# Patient Record
Sex: Female | Born: 1995 | Race: White | Hispanic: No | Marital: Single | Smoking: Former smoker
Health system: Southern US, Community
[De-identification: ages and names within clinical notes are randomized; demographics above are authoritative.]

## PROBLEM LIST (undated history)

## (undated) ENCOUNTER — Emergency Department: Admission: EM | Discharge status: Absent without leave | Payer: No Typology Code available for payment source

## (undated) DIAGNOSIS — R87612 Low grade squamous intraepithelial lesion on cytologic smear of cervix (LGSIL): Secondary | ICD-10-CM

## (undated) DIAGNOSIS — K219 Gastro-esophageal reflux disease without esophagitis: Secondary | ICD-10-CM

## (undated) DIAGNOSIS — H10029 Other mucopurulent conjunctivitis, unspecified eye: Secondary | ICD-10-CM

## (undated) HISTORY — DX: Other mucopurulent conjunctivitis, unspecified eye: H10.029

## (undated) HISTORY — PX: WISDOM TOOTH EXTRACTION: SHX21

## (undated) HISTORY — DX: Low grade squamous intraepithelial lesion on cytologic smear of cervix (LGSIL): R87.612

---
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
## 2015-02-21 LAB — HM PAP SMEAR: HM PAP: NEGATIVE

## 2015-09-03 ENCOUNTER — Ambulatory Visit
Admission: RE | Admit: 2015-09-03 | Discharge: 2015-09-03 | Disposition: A | Discharge status: Home / Self Care | Payer: No Typology Code available for payment source | Source: Ambulatory Visit | Attending: Pediatrics | Admitting: Pediatrics

## 2015-09-03 ENCOUNTER — Other Ambulatory Visit: Payer: Self-pay | Admitting: Pediatrics

## 2015-09-03 DIAGNOSIS — M25562 Pain in left knee: Secondary | ICD-10-CM | POA: Insufficient documentation

## 2015-10-20 ENCOUNTER — Other Ambulatory Visit
Admission: RE | Admit: 2015-10-20 | Discharge: 2015-10-20 | Disposition: A | Discharge status: Home / Self Care | Payer: No Typology Code available for payment source | Attending: Family Medicine | Admitting: Family Medicine

## 2015-10-20 NOTE — ED Notes (Signed)
Pt brought to ED by [employer] officer Koleen Nimrod for forensic blood draw; blood drawn from right antecubital per protocol with kit provided by officer; blood labeled by officer Koleen Nimrod with this nurse present and officer left with specimens
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
## 2017-10-13 DIAGNOSIS — R87612 Low grade squamous intraepithelial lesion on cytologic smear of cervix (LGSIL): Secondary | ICD-10-CM

## 2017-10-13 HISTORY — DX: Low grade squamous intraepithelial lesion on cytologic smear of cervix (LGSIL): R87.612

## 2017-10-24 ENCOUNTER — Ambulatory Visit (INDEPENDENT_AMBULATORY_CARE_PROVIDER_SITE_OTHER): Payer: BLUE CROSS/BLUE SHIELD | Admitting: Obstetrics and Gynecology

## 2017-10-24 ENCOUNTER — Encounter: Payer: Self-pay | Admitting: Obstetrics and Gynecology

## 2017-10-24 VITALS — BP 100/60 | Ht 63.0 in | Wt 125.0 lb

## 2017-10-24 DIAGNOSIS — Z113 Encounter for screening for infections with a predominantly sexual mode of transmission: Secondary | ICD-10-CM

## 2017-10-24 DIAGNOSIS — Z01419 Encounter for gynecological examination (general) (routine) without abnormal findings: Secondary | ICD-10-CM

## 2017-10-24 DIAGNOSIS — Z30011 Encounter for initial prescription of contraceptive pills: Secondary | ICD-10-CM

## 2017-10-24 DIAGNOSIS — Z124 Encounter for screening for malignant neoplasm of cervix: Secondary | ICD-10-CM | POA: Diagnosis not present

## 2017-10-24 MED ORDER — LEVONORGESTREL-ETHINYL ESTRAD 0.1-20 MG-MCG PO TABS: 1.0000 | ORAL_TABLET | Freq: Every day | ORAL | 12 refills | Status: DC

## 2017-10-24 NOTE — Progress Notes (Signed)
PCP:  Tresa ResJohnson, David S, MD   Chief Complaint  Patient presents with  . Gynecologic Exam     HPI:      [name] is a 21 y.o. No obstetric history on file. who LMP was Patient's last menstrual period was 10/15/2017., presents today for her annual examination.  Her menses are regular every 28-30 days, lasting 6 days.  Dysmenorrhea mild, occurring first 1-2 days of flow. She does not have intermenstrual bleeding.  Sex activity: single partner, contraception - none.  Pt interested in OCPs. Did tri sprintec in the past but had mood changes/depression sx with them. Hx of depression in the past anyway but pt better now. No SI. Hx of STDs: none  There is no FH of breast cancer. There is no FH of ovarian cancer. The patient does not do self-breast exams.  Tobacco use: The patient currently smokes 1/2 packs of cigarettes per day for the past several years. Alcohol use: social drinker No drug use.  Exercise: moderately active  She does not get adequate calcium and Vitamin D in her diet.   Past Medical History:  Diagnosis Date  . Pink eye     Past Surgical History:  Procedure Laterality Date  . WISDOM TOOTH EXTRACTION      Family History  Problem Relation Age of Onset  . Cancer Maternal Grandmother 40       cyst or cancer not sure    Social History   Socioeconomic History  . Marital status: Single    Spouse name: Not on file  . Number of children: Not on file  . Years of education: Not on file  . Highest education level: Not on file  Social Needs  . Financial resource strain: Not on file  . Food insecurity - worry: Not on file  . Food insecurity - inability: Not on file  . Transportation needs - medical: Not on file  . Transportation needs - non-medical: Not on file  Occupational History  . Not on file  Tobacco Use  . Smoking status: Current Every Day Smoker  . Smokeless tobacco: Never Used  Substance and Sexual Activity  . Alcohol use: Yes  . Drug  use: No  . Sexual activity: Yes    Birth control/protection: None  Other Topics Concern  . Not on file  Social History Narrative  . Not on file    No outpatient medications have been marked as taking for the 10/24/17 encounter (Office Visit) with Esma Kilts B, PA-C.     ROS:  Review of Systems  Constitutional: Positive for fatigue. Negative for fever and unexpected weight change.  Respiratory: Negative for cough, shortness of breath and wheezing.   Cardiovascular: Negative for chest pain, palpitations and leg swelling.  Gastrointestinal: Negative for blood in stool, constipation, diarrhea, nausea and vomiting.  Endocrine: Negative for cold intolerance, heat intolerance and polyuria.  Genitourinary: Negative for dyspareunia, dysuria, flank pain, frequency, genital sores, hematuria, menstrual problem, pelvic pain, urgency, vaginal bleeding, vaginal discharge and vaginal pain.  Musculoskeletal: Negative for back pain, joint swelling and myalgias.  Skin: Negative for rash.  Neurological: Negative for dizziness, syncope, light-headedness, numbness and headaches.  Hematological: Negative for adenopathy.  Psychiatric/Behavioral: Negative for agitation, confusion, dysphoric mood, sleep disturbance and suicidal ideas. The patient is not nervous/anxious.      Objective: BP 100/60   Ht 5\' 3"  (1.6 m)   Wt 125 lb (56.7 kg)   LMP 10/15/2017   BMI 22.14 kg/m  Physical Exam  Constitutional: She is oriented to person, place, and time. She appears well-developed and well-nourished.  Genitourinary: Vagina normal and uterus normal. There is no rash or tenderness on the right labia. There is no rash or tenderness on the left labia. No erythema or tenderness in the vagina. No vaginal discharge found. Right adnexum does not display mass and does not display tenderness. Left adnexum does not display mass and does not display tenderness. Cervix does not exhibit motion tenderness or polyp.  Uterus is not enlarged or tender.  Neck: Normal range of motion. No thyromegaly present.  Cardiovascular: Normal rate, regular rhythm and normal heart sounds.  No murmur heard. Pulmonary/Chest: Effort normal and breath sounds normal. Right breast exhibits no mass, no nipple discharge, no skin change and no tenderness. Left breast exhibits no mass, no nipple discharge, no skin change and no tenderness.  Abdominal: Soft. There is no tenderness. There is no guarding.  Musculoskeletal: Normal range of motion.  Neurological: She is alert and oriented to person, place, and time. No cranial nerve deficit.  Psychiatric: She has a normal mood and affect. Her behavior is normal.  Vitals reviewed.   Assessment/Plan: Encounter for annual routine gynecological examination  Cervical cancer screening - Plan: IGP,CtNgTv,rfx Aptima HPV ASCU  Screening for STD (sexually transmitted disease) - Plan: IGP,CtNgTv,rfx Aptima HPV ASCU  Encounter for initial prescription of contraceptive pills - OCP start with menses. Condoms for 1 mo. Rx aviane. F/u prn depression sx. - Plan: levonorgestrel-ethinyl estradiol (AVIANE) 0.1-20 MG-MCG tablet  Meds ordered this encounter  Medications  . levonorgestrel-ethinyl estradiol (AVIANE) 0.1-20 MG-MCG tablet    Sig: Take 1 tablet daily by mouth.    Dispense:  28 tablet    Refill:  12             GYN counsel family planning choices, adequate intake of calcium and vitamin D, diet and exercise     F/U  Return in about 1 year (around 10/24/2018).  Aayden Cefalu B. Janyla Biscoe, PA-C 10/24/2017 9:20 AM

## 2017-10-24 NOTE — Patient Instructions (Addendum)
I value your feedback and entrusting us with your care. If you get a [facility] patient survey, I would appreciate you taking the time to let us know about your experience. Thank you!

## 2017-10-28 LAB — IGP,CTNGTV,RFX APTIMA HPV ASCU
CHLAMYDIA, NUC. ACID AMP: NEGATIVE
GONOCOCCUS, NUC. ACID AMP: NEGATIVE
PAP Smear Comment: 0
Trich vag by NAA: NEGATIVE

## 2017-11-01 ENCOUNTER — Encounter: Payer: Self-pay | Admitting: Obstetrics and Gynecology

## 2017-11-02 ENCOUNTER — Telehealth: Payer: Self-pay | Admitting: Obstetrics and Gynecology

## 2017-11-02 NOTE — Telephone Encounter (Signed)
LMTRC

## 2017-11-02 NOTE — Telephone Encounter (Signed)
Pt is calling back due to missed call. Please advise °

## 2017-11-02 NOTE — Telephone Encounter (Signed)
Done

## 2018-10-03 ENCOUNTER — Other Ambulatory Visit: Payer: Self-pay | Admitting: Obstetrics and Gynecology

## 2018-10-03 DIAGNOSIS — Z30011 Encounter for initial prescription of contraceptive pills: Secondary | ICD-10-CM

## 2018-10-29 ENCOUNTER — Other Ambulatory Visit: Payer: Self-pay | Admitting: Obstetrics and Gynecology

## 2018-10-29 DIAGNOSIS — Z30011 Encounter for initial prescription of contraceptive pills: Secondary | ICD-10-CM

## 2018-11-11 ENCOUNTER — Other Ambulatory Visit: Payer: Self-pay | Admitting: Obstetrics and Gynecology

## 2018-11-11 DIAGNOSIS — Z30011 Encounter for initial prescription of contraceptive pills: Secondary | ICD-10-CM

## 2019-05-10 ENCOUNTER — Other Ambulatory Visit: Payer: Self-pay

## 2019-05-10 ENCOUNTER — Ambulatory Visit (INDEPENDENT_AMBULATORY_CARE_PROVIDER_SITE_OTHER): Payer: PRIVATE HEALTH INSURANCE | Admitting: Obstetrics and Gynecology

## 2019-05-10 ENCOUNTER — Encounter: Payer: Self-pay | Admitting: Obstetrics and Gynecology

## 2019-05-10 DIAGNOSIS — Z3A01 Less than 8 weeks gestation of pregnancy: Secondary | ICD-10-CM

## 2019-05-10 DIAGNOSIS — Z3401 Encounter for supervision of normal first pregnancy, first trimester: Secondary | ICD-10-CM

## 2019-05-10 NOTE — Progress Notes (Signed)
NOB C/o nausea, some cramping, no vb  No flu shot this season  Worried about how much she has been drinking, and current smoker
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
## 2019-05-25 ENCOUNTER — Other Ambulatory Visit: Payer: Self-pay

## 2019-05-25 ENCOUNTER — Ambulatory Visit (INDEPENDENT_AMBULATORY_CARE_PROVIDER_SITE_OTHER): Payer: PRIVATE HEALTH INSURANCE | Admitting: Obstetrics and Gynecology

## 2019-05-25 ENCOUNTER — Ambulatory Visit (INDEPENDENT_AMBULATORY_CARE_PROVIDER_SITE_OTHER): Payer: PRIVATE HEALTH INSURANCE

## 2019-05-25 ENCOUNTER — Other Ambulatory Visit (HOSPITAL_COMMUNITY)
Admission: RE | Admit: 2019-05-25 | Discharge: 2019-05-25 | Disposition: A | Discharge status: Home / Self Care | Payer: PRIVATE HEALTH INSURANCE | Source: Ambulatory Visit | Attending: Obstetrics and Gynecology | Admitting: Obstetrics and Gynecology

## 2019-05-25 VITALS — BP 110/70 | Wt 131.0 lb

## 2019-05-25 DIAGNOSIS — Z124 Encounter for screening for malignant neoplasm of cervix: Secondary | ICD-10-CM

## 2019-05-25 DIAGNOSIS — O0990 Supervision of high risk pregnancy, unspecified, unspecified trimester: Secondary | ICD-10-CM | POA: Insufficient documentation

## 2019-05-25 DIAGNOSIS — O3481 Maternal care for other abnormalities of pelvic organs, first trimester: Secondary | ICD-10-CM | POA: Diagnosis not present

## 2019-05-25 DIAGNOSIS — Z3A10 10 weeks gestation of pregnancy: Secondary | ICD-10-CM | POA: Diagnosis not present

## 2019-05-25 DIAGNOSIS — Z3401 Encounter for supervision of normal first pregnancy, first trimester: Secondary | ICD-10-CM | POA: Insufficient documentation

## 2019-05-25 DIAGNOSIS — Z3A09 9 weeks gestation of pregnancy: Secondary | ICD-10-CM

## 2019-05-25 DIAGNOSIS — O219 Vomiting of pregnancy, unspecified: Secondary | ICD-10-CM

## 2019-05-25 DIAGNOSIS — Z113 Encounter for screening for infections with a predominantly sexual mode of transmission: Secondary | ICD-10-CM

## 2019-05-25 DIAGNOSIS — N8312 Corpus luteum cyst of left ovary: Secondary | ICD-10-CM | POA: Diagnosis not present

## 2019-05-25 LAB — OB RESULTS CONSOLE VARICELLA ZOSTER ANTIBODY, IGG: Varicella: NON-IMMUNE/NOT IMMUNE

## 2019-05-25 MED ORDER — DOXYLAMINE-PYRIDOXINE 10-10 MG PO TBEC: 2.0000 | DELAYED_RELEASE_TABLET | Freq: Every day | ORAL | 5 refills | Status: DC

## 2019-05-25 NOTE — Progress Notes (Signed)
    Routine Prenatal Care Visit  Subjective  Amy Sullivan is a 23 y.o. G1P0000 at [redacted]w[redacted]d being seen today for ongoing prenatal care.  She is currently monitored for the following issues for this low-risk pregnancy and has Encounter for supervision of normal first pregnancy in first trimester on their problem list.  ----------------------------------------------------------------------------------- Patient reports nausea.   Contractions: Not present. Vag. Bleeding: None.  Movement: Absent. Denies leaking of fluid.  ----------------------------------------------------------------------------------- The following portions of the patient's history were reviewed and updated as appropriate: allergies, current medications, past family history, past medical history, past social history, past surgical history and problem list. Problem list updated.   Objective  Blood pressure 110/70, weight 131 lb (59.4 kg), last menstrual period 03/18/2019. Pregravid weight 125 lb (56.7 kg) Total Weight Gain 6 lb (2.722 kg) Urinalysis:      Fetal Status: Fetal Heart Rate (bpm): 173   Movement: Absent     General:  Alert, oriented and cooperative. Patient is in no acute distress.  Skin: Skin is warm and dry. No rash noted.   Cardiovascular: Normal heart rate noted  Respiratory: Normal respiratory effort, no problems with respiration noted  Abdomen: Soft, gravid, appropriate for gestational age. Pain/Pressure: Absent     Pelvic:  Cervical exam: closed, long       No pelvic tenderness, no bleeding  Extremities: Normal range of motion.     Mental Status: Normal mood and affect. Normal behavior. Normal judgment and thought content.     Assessment   23 y.o. G1P0000 at [redacted]w[redacted]d by  12/23/2019, by Last Menstrual Period presenting for routine prenatal visit  Plan   Pregnancy #1 Problems (from 03/18/19 to present)    Problem Noted Resolved   Encounter for supervision of normal first pregnancy in first trimester  05/25/2019 by Homero Fellers, MD No   Overview Signed 05/25/2019  8:42 PM by Homero Fellers, MD      Clinic [facility] Prenatal Labs  Dating LMP=10wk [place] Blood type:     Genetic Screen NIPS:    Antibody:   Anatomic [place]  Rubella:   Varicella: @VZVIGG @  GTT Early:        28 wk:      RPR:     Rhogam  HBsAg:     TDaP vaccine                       HIV:     Flu Shot                                GBS:   Contraception  Pap:  CBB     CS/VBAC    Baby Food    Support Person                 Gestational age appropriate obstetric precautions including but not limited to vaginal bleeding, contractions, leaking of fluid and fetal movement were reviewed in detail with the patient.    NOB labs today Dating [place] today  Return in about 5 weeks (around 06/29/2019) for [place] telephone.  Homero Fellers MD [facility], [facility] 05/25/2019, 8:42 PM

## 2019-05-25 NOTE — Progress Notes (Signed)
ROB/Dating  C/o nausea

## 2019-05-27 LAB — URINE CULTURE: Organism ID, Bacteria: NO GROWTH

## 2019-05-28 ENCOUNTER — Other Ambulatory Visit: Payer: Self-pay | Admitting: Obstetrics and Gynecology

## 2019-05-29 LAB — RPR+RH+ABO+RUB AB+AB SCR+CB...
Antibody Screen: NEGATIVE
HIV Screen 4th Generation wRfx: NONREACTIVE
Hematocrit: 40.6 % (ref 34.0–46.6)
Hemoglobin: 14 g/dL (ref 11.1–15.9)
Hepatitis B Surface Ag: NEGATIVE
MCH: 31 pg (ref 26.6–33.0)
MCHC: 34.5 g/dL (ref 31.5–35.7)
MCV: 90 fL (ref 79–97)
Platelets: 294 10*3/uL (ref 150–450)
RBC: 4.51 x10E6/uL (ref 3.77–5.28)
RDW: 12.2 % (ref 11.7–15.4)
RPR Ser Ql: NONREACTIVE
Rh Factor: POSITIVE
Rubella Antibodies, IGG: 3.85 index (ref >0.99)
Varicella zoster IgG: 153 index — ABNORMAL LOW (ref >165)
WBC: 11.4 10*3/uL — ABNORMAL HIGH (ref 3.4–10.8)

## 2019-05-29 LAB — MONITOR DRUG PROFILE 10(MW)
Amphetamine Scrn, Ur: NEGATIVE ng/mL
BARBITURATE SCREEN URINE: NEGATIVE ng/mL
BENZODIAZEPINE SCREEN, URINE: NEGATIVE ng/mL
Cocaine (Metab) Scrn, Ur: NEGATIVE ng/mL
Creatinine(Crt), U: 86.7 mg/dL (ref 20.0–300.0)
Methadone Screen, Urine: NEGATIVE ng/mL
OXYCODONE+OXYMORPHONE UR QL SCN: NEGATIVE ng/mL
Opiate Scrn, Ur: NEGATIVE ng/mL
Ph of Urine: 7.5 (ref 4.5–8.9)
Phencyclidine Qn, Ur: NEGATIVE ng/mL
Propoxyphene Scrn, Ur: NEGATIVE ng/mL

## 2019-05-29 LAB — CANNABINOID (GC/MS), URINE
Cannabinoid: POSITIVE — AB
Carboxy THC (GC/MS): >400 ng/mL

## 2019-05-30 LAB — CERVICOVAGINAL ANCILLARY ONLY
Chlamydia: NEGATIVE
Neisseria Gonorrhea: NEGATIVE

## 2019-05-30 NOTE — Progress Notes (Signed)
.  released to mychart with a note.

## 2019-05-31 LAB — CYTOLOGY - PAP: Diagnosis: NEGATIVE

## 2019-05-31 NOTE — Progress Notes (Signed)
NIL - released to [employer]

## 2019-06-01 LAB — MATERNIT 21 PLUS CORE, BLOOD
Fetal Fraction: 6
Result (T21): NEGATIVE
Trisomy 13 (Patau syndrome): NEGATIVE
Trisomy 18 (Edwards syndrome): NEGATIVE
Trisomy 21 (Down syndrome): NEGATIVE

## 2019-06-01 NOTE — Progress Notes (Signed)
Called, no answer, left message to check mychart

## 2019-06-29 ENCOUNTER — Other Ambulatory Visit: Payer: Self-pay

## 2019-06-29 ENCOUNTER — Encounter: Payer: Self-pay | Admitting: Obstetrics and Gynecology

## 2019-06-29 ENCOUNTER — Ambulatory Visit (INDEPENDENT_AMBULATORY_CARE_PROVIDER_SITE_OTHER): Payer: PRIVATE HEALTH INSURANCE | Admitting: Obstetrics and Gynecology

## 2019-06-29 DIAGNOSIS — Z3A14 14 weeks gestation of pregnancy: Secondary | ICD-10-CM

## 2019-06-29 DIAGNOSIS — Z3401 Encounter for supervision of normal first pregnancy, first trimester: Secondary | ICD-10-CM

## 2019-06-29 DIAGNOSIS — Z3402 Encounter for supervision of normal first pregnancy, second trimester: Secondary | ICD-10-CM

## 2019-06-29 NOTE — Progress Notes (Signed)
ROB telephone C/o nausea medication was to expensive

## 2019-06-29 NOTE — Progress Notes (Signed)
Routine Prenatal Care Visit- Virtual Visit  Subjective   Virtual Visit via Telephone Note  I connected with@ on 06/29/19 at 10:10 AM EDT by telephone and verified that I am speaking with the correct person using two identifiers.   I discussed the limitations, risks, security and privacy concerns of performing an evaluation and management service by telephone and the availability of in person appointments. I also discussed with the patient that there may be a patient responsible charge related to this service. The patient expressed understanding and agreed to proceed.  The patient was driving I spoke with the patient from my  office The names of people involved in this encounter were: Dr. Jerene PitchSchuman and Bartolo DarterElizabeth Premo.   Amy Sullivan is a 23 y.o. G1P0000 at 654w5d being seen today for ongoing prenatal care.  She is currently monitored for the following issues for this low-risk pregnancy and has Encounter for supervision of normal first pregnancy in first trimester on their problem list.  ----------------------------------------------------------------------------------- Patient reports no complaints.  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  Contractions: Not present. Vag. Bleeding: None.  Movement: Absent. Denies leaking of fluid.  ----------------------------------------------------------------------------------- The following portions of the patient's history were reviewed and updated as appropriate: allergies, current medications, past family history, past medical history, past social history, past surgical history and problem list. Problem list updated.   Objective  Last menstrual period 03/18/2019. Pregravid weight 125 lb (56.7 kg) Total Weight Gain 6 lb (2.722 kg) Urinalysis:      Fetal Status:     Movement: Absent     Physical Exam could not be performed. Because of the COVID-19 outbreak this visit  was performed over the phone and not in person.   Assessment   22 y.o. G1P0000 at 194w5d by  12/23/2019, by Last Menstrual Period presenting for routine prenatal visit  Plan   Pregnancy #1 Problems (from 03/18/19 to present)    Problem Noted Resolved   Encounter for supervision of normal first pregnancy in first trimester 05/25/2019 by Natale MilchSchuman, Shene Maxfield R, MD No   Overview Addendum 06/29/2019 10:15 AM by Natale MilchSchuman, Grecia Lynk R, MD      Clinic [facility] Prenatal Labs  Dating LMP=10wk US Blood type: A/Positive/-- (06/12 1030)   Genetic Screen NIPS:  Normal XX  Antibody:Negative (06/12 1030)  Anatomic US  Rubella: 3.85 (06/12 1030) Varicella: equivocal   GTT 28 wk:      RPR: Non Reactive (06/12 1030)   Rhogam  Not needed HBsAg: Negative (06/12 1030)   TDaP vaccine                       HIV: Non Reactive (06/12 1030)   Flu Shot                                GBS:   Contraception  Pap:NIL 2020  CBB     CS/VBAC    Baby Food    Support Person                Gestational age appropriate obstetric precautions including but not limited to vaginal bleeding, contractions, leaking of fluid and fetal movement were reviewed in detail with the patient.     Follow Up Instructions:  ROB and US in 4 weeks   I discussed the assessment and treatment plan with  the patient. The patient was provided an opportunity to ask questions and all were answered. The patient agreed with the plan and demonstrated an understanding of the instructions.   The patient was advised to call back or seek an in-person evaluation if the symptoms worsen or if the condition fails to improve as anticipated.  I provided 10 minutes of non-face-to-face time during this encounter.  Return in about 4 weeks (around 07/27/2019) for [place] and [place] in person.  Adrian Prows MD [facility], [facility] 06/29/2019 10:16 AM

## 2019-07-27 ENCOUNTER — Ambulatory Visit (INDEPENDENT_AMBULATORY_CARE_PROVIDER_SITE_OTHER): Payer: PRIVATE HEALTH INSURANCE | Admitting: Maternal Newborn

## 2019-07-27 ENCOUNTER — Encounter: Payer: Self-pay | Admitting: Maternal Newborn

## 2019-07-27 ENCOUNTER — Other Ambulatory Visit: Payer: Self-pay

## 2019-07-27 ENCOUNTER — Encounter: Payer: PRIVATE HEALTH INSURANCE | Admitting: Obstetrics and Gynecology

## 2019-07-27 ENCOUNTER — Ambulatory Visit (INDEPENDENT_AMBULATORY_CARE_PROVIDER_SITE_OTHER): Payer: PRIVATE HEALTH INSURANCE

## 2019-07-27 VITALS — BP 90/60 | Wt 132.2 lb

## 2019-07-27 DIAGNOSIS — Z363 Encounter for antenatal screening for malformations: Secondary | ICD-10-CM | POA: Diagnosis not present

## 2019-07-27 DIAGNOSIS — Z3A18 18 weeks gestation of pregnancy: Secondary | ICD-10-CM

## 2019-07-27 DIAGNOSIS — Z3400 Encounter for supervision of normal first pregnancy, unspecified trimester: Secondary | ICD-10-CM

## 2019-07-27 DIAGNOSIS — Z3401 Encounter for supervision of normal first pregnancy, first trimester: Secondary | ICD-10-CM

## 2019-07-27 DIAGNOSIS — O219 Vomiting of pregnancy, unspecified: Secondary | ICD-10-CM

## 2019-07-27 LAB — POCT URINALYSIS DIPSTICK OB
Glucose, UA: NEGATIVE
POC,PROTEIN,UA: NEGATIVE

## 2019-07-27 MED ORDER — PROMETHAZINE HCL 25 MG PO TABS: 25.0000 mg | ORAL_TABLET | Freq: Four times a day (QID) | ORAL | 2 refills | Status: DC | PRN

## 2019-07-27 NOTE — Progress Notes (Signed)
Routine Prenatal Care Visit  Subjective  Amy Sullivan is a 23 y.o. G1P0000 at [redacted]w[redacted]d being seen today for ongoing prenatal care.  She is currently monitored for the following issues for this low-risk pregnancy and has Encounter for supervision of normal first pregnancy in first trimester on their problem list.  ----------------------------------------------------------------------------------- Patient reports daily emesis and heartburn. She has not been taking any medications; Diclegis was not affordable for her.   Contractions: Not present. Vag. Bleeding: None.  Movement: Present. No leaking of fluid.  ----------------------------------------------------------------------------------- The following portions of the patient's history were reviewed and updated as appropriate: allergies, current medications, past family history, past medical history, past social history, past surgical history and problem list. Problem list updated.   Objective  Blood pressure 90/60, weight 132 lb 3.2 oz (60 kg), last menstrual period 03/18/2019. Pregravid weight 125 lb (56.7 kg) Total Weight Gain 7 lb 3.2 oz (3.266 kg)  Urinalysis: Urine dipstick shows negative for glucose, protein.  Fetal Status: Fetal Heart Rate (bpm): 144 ([place])   Movement: Present     General:  Alert, oriented and cooperative. Patient is in no acute distress.  Skin: Skin is warm and dry. No rash noted.   Cardiovascular: Normal heart rate noted  Respiratory: Normal respiratory effort, no problems with respiration noted  Abdomen: Soft, gravid, appropriate for gestational age. Pain/Pressure: Absent     Pelvic:  Cervical exam deferred        Extremities: Normal range of motion.     Mental Status: Normal mood and affect. Normal behavior. Normal judgment and thought content.     Assessment   23 y.o. G1P0000 at [redacted]w[redacted]d, EDD 12/23/2019 by Last Menstrual Period presenting for a routine prenatal visit.  Plan   Pregnancy #1 Problems  (from 03/18/19 to present)    Problem Noted Resolved   Encounter for supervision of normal first pregnancy in first trimester 05/25/2019 by Homero Fellers, MD No   Overview Addendum 06/29/2019 10:15 AM by Homero Fellers, MD      Clinic [facility] Prenatal Labs  Dating LMP=10wk [place] Blood type: A/Positive/-- (06/12 1030)   Genetic Screen NIPS:  Normal XX  Antibody:Negative (06/12 1030)  Anatomic [place]  Rubella: 3.85 (06/12 1030) Varicella: equivocal   GTT 28 wk:      RPR: Non Reactive (06/12 1030)   Rhogam  Not needed HBsAg: Negative (06/12 1030)   TDaP vaccine                       HIV: Non Reactive (06/12 1030)   Flu Shot                                GBS:   Contraception  Pap:NIL 2020  CBB     CS/VBAC    Baby Food    Support Person              Anatomy scan is complete with normal female anatomy, FHR 625 bpm, cephalic presentation. Results reviewed with patient and her mother.  She has been using cannabis to control nausea but is trying to quit. Agreed to try medication to see if vomiting can be controlled. Rx sent for promethazine. Also plans to take Pepcid to help with heartburn/reflux. Taking prenatal gummy vitamins, recommended SlowFe iron supplement if they do not contain iron.  Please refer to After Visit Summary for other counseling recommendations.   Return  in about 4 weeks (around 08/24/2019) for ROB telephone.  Marcelyn BruinsJacelyn Schmid, CNM 07/27/2019  11:36 AM

## 2019-07-27 NOTE — Progress Notes (Signed)
ROB and U/S- no concerns

## 2019-07-27 NOTE — Patient Instructions (Signed)

## 2019-07-31 ENCOUNTER — Encounter: Payer: Self-pay | Admitting: Obstetrics and Gynecology

## 2019-08-24 ENCOUNTER — Other Ambulatory Visit: Payer: Self-pay

## 2019-08-24 ENCOUNTER — Other Ambulatory Visit: Payer: Self-pay | Admitting: Obstetrics & Gynecology

## 2019-08-24 ENCOUNTER — Telehealth: Payer: Self-pay | Admitting: Obstetrics & Gynecology

## 2019-08-24 ENCOUNTER — Ambulatory Visit (INDEPENDENT_AMBULATORY_CARE_PROVIDER_SITE_OTHER): Payer: PRIVATE HEALTH INSURANCE | Admitting: Obstetrics & Gynecology

## 2019-08-24 ENCOUNTER — Encounter: Payer: Self-pay | Admitting: Obstetrics & Gynecology

## 2019-08-24 DIAGNOSIS — Z3401 Encounter for supervision of normal first pregnancy, first trimester: Secondary | ICD-10-CM

## 2019-08-24 DIAGNOSIS — Z3402 Encounter for supervision of normal first pregnancy, second trimester: Secondary | ICD-10-CM

## 2019-08-24 DIAGNOSIS — Z3A22 22 weeks gestation of pregnancy: Secondary | ICD-10-CM

## 2019-08-24 DIAGNOSIS — Z131 Encounter for screening for diabetes mellitus: Secondary | ICD-10-CM

## 2019-08-24 NOTE — Telephone Encounter (Signed)
Patient is schedule for 09/21/19 for ROB w glucola. Please place order thank you!

## 2019-08-24 NOTE — Progress Notes (Signed)
Virtual Visit via Telephone Note  I connected with patient on 08/24/19 at  9:40 AM EDT by telephone and verified that I am speaking with the correct person using two identifiers.   I discussed the limitations, risks, security and privacy concerns of performing an evaluation and management service by telephone and the availability of in person appointments. I also discussed with the patient that there may be a patient responsible charge related to this service. The patient expressed understanding and agreed to proceed.  The patient was at home I spoke with the patient from my  office  Amy Sullivan is a 23 y.o. G1P0000 at 85110w5d being seen today for ongoing prenatal care.  She is currently monitored for the following issues for this low-risk pregnancy and has Encounter for supervision of normal first pregnancy in first trimester and Vomiting affecting pregnancy on their problem list.  ----------------------------------------------------------------------------------- Patient reports continued nausea at times, some heartburn.  Has not tried OTC heartburn meds yet. Good FM.Marland Kitchen.   Denies pain, VB, leaking of fluid.  ----------------------------------------------------------------------------------- The following portions of the patient's history were reviewed and updated as appropriate: allergies, current medications, past family history, past medical history, past social history, past surgical history and problem list. Problem list updated.   Objective  Last menstrual period 03/18/2019. Pregravid weight 125 lb (56.7 kg) Total Weight Gain 7 lb 3.2 oz (3.266 kg)  Physical Exam could not be performed. Because of the COVID-19 outbreak this visit was performed over the phone and not in person.   Assessment   23 y.o. G1P0000 at 14110w5d by  12/23/2019, by Last Menstrual Period presenting for routine prenatal visit  Plan   Pregnancy #1 Problems (from 03/18/19 to present)    Problem Noted Resolved   Encounter for supervision of normal first pregnancy in first trimester  No   Overview Addendum 08/24/2019  9:39 AM by Nadara MustardHarris, Jamorion Gomillion P, MD      [facility] Prenatal Labs  Dating LMP=10wk US Blood type: A/Positive/-- (06/12 1030)   Genetic Screen NIPS:  Normal XX  Antibody:Negative (06/12 1030)  Anatomic US Complete 07/27/2019 Rubella: 3.85 (06/12 1030) Varicella: equivocal   GTT 28 wk:      RPR: Non Reactive (06/12 1030)   Rhogam  Not needed HBsAg: Negative (06/12 1030)   TDaP vaccine        due               HIV: Non Reactive (06/12 1030)   Flu Shot       due                         GBS:   Contraception    pill Pap:NIL 2020  CBB     no   CS/VBAC    n/a   Baby Food    breast   Support Person     mother    Fob not involved            Counseled as to PTL sx's, Breast feeding (plans to pump and feed), Contraception (no implants, prefers pill).  Mother as support person, FOB not involved.  Glucola nv  Flu shot nv  Works at [employer]; counseled as to [employer] risks and to wear protective masks, wash hands, socially distance herself as best she can  Gestational age appropriate obstetric precautions including but not limited to vaginal bleeding, contractions, leaking of fluid and fetal movement were reviewed in detail with the patient.  Follow Up Instructions: 4 weeks w Glucola   I discussed the assessment and treatment plan with the patient. The patient was provided an opportunity to ask questions and all were answered. The patient agreed with the plan and demonstrated an understanding of the instructions.   The patient was advised to call back or seek an in-person evaluation if the symptoms worsen or if the condition fails to improve as anticipated.  I provided 10 minutes of non-face-to-face time during this encounter.  Return in about 4 weeks (around 09/21/2019) for ROB w glucola.  Barnett Applebaum, MD [facility] 08/24/2019 9:42 AM

## 2019-08-24 NOTE — Telephone Encounter (Signed)
-----   Message from Gae Dry, MD sent at 08/24/2019  9:39 AM EDT ----- Regarding: ROB w [place] [facility] [place] in 4 weeks

## 2019-09-21 ENCOUNTER — Other Ambulatory Visit: Payer: PRIVATE HEALTH INSURANCE

## 2019-09-21 ENCOUNTER — Other Ambulatory Visit: Payer: Self-pay

## 2019-09-21 ENCOUNTER — Ambulatory Visit (INDEPENDENT_AMBULATORY_CARE_PROVIDER_SITE_OTHER): Payer: PRIVATE HEALTH INSURANCE | Admitting: Maternal Newborn

## 2019-09-21 ENCOUNTER — Encounter: Payer: Self-pay | Admitting: Maternal Newborn

## 2019-09-21 VITALS — BP 110/80 | Wt 138.0 lb

## 2019-09-21 DIAGNOSIS — Z131 Encounter for screening for diabetes mellitus: Secondary | ICD-10-CM

## 2019-09-21 DIAGNOSIS — Z3400 Encounter for supervision of normal first pregnancy, unspecified trimester: Secondary | ICD-10-CM

## 2019-09-21 DIAGNOSIS — Z3402 Encounter for supervision of normal first pregnancy, second trimester: Secondary | ICD-10-CM

## 2019-09-21 DIAGNOSIS — Z3A26 26 weeks gestation of pregnancy: Secondary | ICD-10-CM

## 2019-09-21 LAB — POCT URINALYSIS DIPSTICK OB
Glucose, UA: NEGATIVE
POC,PROTEIN,UA: NEGATIVE

## 2019-09-21 NOTE — Progress Notes (Signed)
ROB/1 hr GTT- no concerns/declines flu shot

## 2019-09-21 NOTE — Progress Notes (Signed)
    Routine Prenatal Care Visit  Subjective  Amy Sullivan is a 23 y.o. G1P0000 at [redacted]w[redacted]d being seen today for ongoing prenatal care.  She is currently monitored for the following issues for this low-risk pregnancy and has Encounter for supervision of normal first pregnancy in first trimester and Vomiting affecting pregnancy on their problem list.  ----------------------------------------------------------------------------------- Patient reports some indigestion the past couple of days. Taking Tums helped. Contractions: Not present. Vag. Bleeding: None.  Movement: Present. No leaking of fluid.  ----------------------------------------------------------------------------------- The following portions of the patient's history were reviewed and updated as appropriate: allergies, current medications, past family history, past medical history, past social history, past surgical history and problem list. Problem list updated.   Objective  Blood pressure 110/80, weight 138 lb (62.6 kg), last menstrual period 03/18/2019. Pregravid weight 125 lb (56.7 kg) Total Weight Gain 13 lb (5.897 kg) Urinalysis: Urine dipstick shows negative for glucose, protein.  Fetal Status: Fetal Heart Rate (bpm): 138 Fundal Height: 24 cm Movement: Present     General:  Alert, oriented and cooperative. Patient is in no acute distress.  Skin: Skin is warm and dry. No rash noted.   Cardiovascular: Normal heart rate noted  Respiratory: Normal respiratory effort, no problems with respiration noted  Abdomen: Soft, gravid, appropriate for gestational age. Pain/Pressure: Absent     Pelvic:  Cervical exam deferred        Extremities: Normal range of motion.  Edema: None  Mental Status: Normal mood and affect. Normal behavior. Normal judgment and thought content.     Assessment   23 y.o. G1P0000 at [redacted]w[redacted]d, EDD 12/23/2019 by Last Menstrual Period presenting for a routine prenatal visit.  Plan   Pregnancy #1 Problems  (from 03/18/19 to present)    Problem Noted Resolved   Encounter for supervision of normal first pregnancy in first trimester 05/25/2019 by Homero Fellers, MD No   Overview Addendum 08/24/2019  9:39 AM by Gae Dry, MD      [facility]inic [facility] Prenatal Labs  Dating LMP=10wk [place] Blood type: A/Positive/-- (06/12 1030)   Genetic Screen NIPS:  Normal XX  Antibody:Negative (06/12 1030)  Anatomic [place] Complete 07/27/2019 Rubella: 3.85 (06/12 1030) Varicella: equivocal   GTT 28 wk:      RPR: Non Reactive (06/12 1030)   Rhogam  Not needed HBsAg: Negative (06/12 1030)   TDaP vaccine        due               HIV: Non Reactive (06/12 1030)   Flu Shot       due                         GBS:   Contraception    pill Pap:NIL 2020  CBB     no   CS/VBAC    n/a   Baby Food    breast   Support Person     mother    Fob not involved           GTT/labs today.  Consider growth ultrasound if fundal height is small for dates next visit.   Advised that she may try Pepcid OTC if desired.  Please refer to After Visit Summary for other counseling recommendations.   Return in about 2 weeks (around 10/05/2019) for ROB.  [name], CNM 09/21/2019  10:48 AM

## 2019-09-21 NOTE — Patient Instructions (Signed)

## 2019-09-22 LAB — 28 WEEK RH+PANEL
Basophils Absolute: 0 10*3/uL (ref 0.0–0.2)
Basos: 0 %
EOS (ABSOLUTE): 0.1 10*3/uL (ref 0.0–0.4)
Eos: 1 %
Gestational Diabetes Screen: 131 mg/dL (ref 65–139)
HIV Screen 4th Generation wRfx: NONREACTIVE
Hematocrit: 33.4 % — ABNORMAL LOW (ref 34.0–46.6)
Hemoglobin: 11.3 g/dL (ref 11.1–15.9)
Immature Grans (Abs): 0.1 10*3/uL (ref 0.0–0.1)
Immature Granulocytes: 1 %
Lymphocytes Absolute: 1.4 10*3/uL (ref 0.7–3.1)
Lymphs: 11 %
MCH: 31.4 pg (ref 26.6–33.0)
MCHC: 33.8 g/dL (ref 31.5–35.7)
MCV: 93 fL (ref 79–97)
Monocytes Absolute: 0.8 10*3/uL (ref 0.1–0.9)
Monocytes: 6 %
Neutrophils Absolute: 10.2 10*3/uL — ABNORMAL HIGH (ref 1.4–7.0)
Neutrophils: 81 %
Platelets: 265 10*3/uL (ref 150–450)
RBC: 3.6 x10E6/uL — ABNORMAL LOW (ref 3.77–5.28)
RDW: 12.8 % (ref 11.7–15.4)
RPR Ser Ql: NONREACTIVE
WBC: 12.5 10*3/uL — ABNORMAL HIGH (ref 3.4–10.8)

## 2019-10-05 ENCOUNTER — Encounter: Payer: Self-pay | Admitting: Maternal Newborn

## 2019-10-05 ENCOUNTER — Other Ambulatory Visit: Payer: Self-pay

## 2019-10-05 ENCOUNTER — Ambulatory Visit (INDEPENDENT_AMBULATORY_CARE_PROVIDER_SITE_OTHER): Payer: PRIVATE HEALTH INSURANCE | Admitting: Maternal Newborn

## 2019-10-05 VITALS — BP 110/60 | Wt 142.0 lb

## 2019-10-05 DIAGNOSIS — Z3A28 28 weeks gestation of pregnancy: Secondary | ICD-10-CM

## 2019-10-05 DIAGNOSIS — O219 Vomiting of pregnancy, unspecified: Secondary | ICD-10-CM

## 2019-10-05 DIAGNOSIS — Z3400 Encounter for supervision of normal first pregnancy, unspecified trimester: Secondary | ICD-10-CM

## 2019-10-05 MED ORDER — PROMETHAZINE HCL 25 MG PO TABS: 25.0000 mg | ORAL_TABLET | Freq: Four times a day (QID) | ORAL | 2 refills | Status: DC | PRN

## 2019-10-05 NOTE — Patient Instructions (Signed)
Third Trimester of Pregnancy The third trimester is from week 28 through week 40 (months 7 through 9). The third trimester is a time when the unborn baby (fetus) is growing rapidly. At the end of the ninth month, the fetus is about 20 inches in length and weighs 6-10 pounds. Body changes during your third trimester Your body will continue to go through many changes during pregnancy. The changes vary from woman to woman. During the third trimester:  Your weight will continue to increase. You can expect to gain 25-35 pounds (11-16 kg) by the end of the pregnancy.  You may begin to get stretch marks on your hips, abdomen, and breasts.  You may urinate more often because the fetus is moving lower into your pelvis and pressing on your bladder.  You may develop or continue to have heartburn. This is caused by increased hormones that slow down muscles in the digestive tract.  You may develop or continue to have constipation because increased hormones slow digestion and cause the muscles that push waste through your intestines to relax.  You may develop hemorrhoids. These are swollen veins (varicose veins) in the rectum that can itch or be painful.  You may develop swollen, bulging veins (varicose veins) in your legs.  You may have increased body aches in the pelvis, back, or thighs. This is due to weight gain and increased hormones that are relaxing your joints.  You may have changes in your hair. These can include thickening of your hair, rapid growth, and changes in texture. Some women also have hair loss during or after pregnancy, or hair that feels dry or thin. Your hair will most likely return to normal after your baby is born.  Your breasts will continue to grow and they will continue to become tender. A yellow fluid (colostrum) may leak from your breasts. This is the first milk you are producing for your baby.  Your belly button may stick out.  You may notice more swelling in your hands,  face, or ankles.  You may have increased tingling or numbness in your hands, arms, and legs. The skin on your belly may also feel numb.  You may feel short of breath because of your expanding uterus.  You may have more problems sleeping. This can be caused by the size of your belly, increased need to urinate, and an increase in your body's metabolism.  You may notice the fetus "dropping," or moving lower in your abdomen (lightening).  You may have increased vaginal discharge.  You may notice your joints feel loose and you may have pain around your pelvic bone. What to expect at prenatal visits You will have prenatal exams every 2 weeks until week 36. Then you will have weekly prenatal exams. During a routine prenatal visit:  You will be weighed to make sure you and the baby are growing normally.  Your blood pressure will be taken.  Your abdomen will be measured to track your baby's growth.  The fetal heartbeat will be listened to.  Any test results from the previous visit will be discussed.  You may have a cervical check near your due date to see if your cervix has softened or thinned (effaced).  You will be tested for Group B streptococcus. This happens between 35 and 37 weeks. Your health care provider may ask you:  What your birth plan is.  How you are feeling.  If you are feeling the baby move.  If you have had any abnormal   symptoms, such as leaking fluid, bleeding, severe headaches, or abdominal cramping.  If you are using any tobacco products, including cigarettes, chewing tobacco, and electronic cigarettes.  If you have any questions. Other tests or screenings that may be performed during your third trimester include:  Blood tests that check for low iron levels (anemia).  Fetal testing to check the health, activity level, and growth of the fetus. Testing is done if you have certain medical conditions or if there are problems during the pregnancy.  Nonstress test  (NST). This test checks the health of your baby to make sure there are no signs of problems, such as the baby not getting enough oxygen. During this test, a belt is placed around your belly. The baby is made to move, and its heart rate is monitored during movement. What is false labor? False labor is a condition in which you feel small, irregular tightenings of the muscles in the womb (contractions) that usually go away with rest, changing position, or drinking water. These are called Braxton Hicks contractions. Contractions may last for hours, days, or even weeks before true labor sets in. If contractions come at regular intervals, become more frequent, increase in intensity, or become painful, you should see your health care provider. What are the signs of labor?  Abdominal cramps.  Regular contractions that start at 10 minutes apart and become stronger and more frequent with time.  Contractions that start on the top of the uterus and spread down to the lower abdomen and back.  Increased pelvic pressure and dull back pain.  A watery or bloody mucus discharge that comes from the vagina.  Leaking of amniotic fluid. This is also known as your "water breaking." It could be a slow trickle or a gush. Let your health care provider know if it has a color or strange odor. If you have any of these signs, call your health care provider right away, even if it is before your due date. Follow these instructions at home: Medicines  Follow your health care provider's instructions regarding medicine use. Specific medicines may be either safe or unsafe to take during pregnancy.  Take a prenatal vitamin that contains at least 600 micrograms (mcg) of folic acid.  If you develop constipation, try taking a stool softener if your health care provider approves. Eating and drinking   Eat a balanced diet that includes fresh fruits and vegetables, whole grains, good sources of protein such as meat, eggs, or tofu,  and low-fat dairy. Your health care provider will help you determine the amount of weight gain that is right for you.  Avoid raw meat and uncooked cheese. These carry germs that can cause birth defects in the baby.  If you have low calcium intake from food, talk to your health care provider about whether you should take a daily calcium supplement.  Eat four or five small meals rather than three large meals a day.  Limit foods that are high in fat and processed sugars, such as fried and sweet foods.  To prevent constipation: ? Drink enough fluid to keep your urine clear or pale yellow. ? Eat foods that are high in fiber, such as fresh fruits and vegetables, whole grains, and beans. Activity  Exercise only as directed by your health care provider. Most women can continue their usual exercise routine during pregnancy. Try to exercise for 30 minutes at least 5 days a week. Stop exercising if you experience uterine contractions.  Avoid heavy lifting.  Do   not exercise in extreme heat or humidity, or at high altitudes.  Wear low-heel, comfortable shoes.  Practice good posture.  You may continue to have sex unless your health care provider tells you otherwise. Relieving pain and discomfort  Take frequent breaks and rest with your legs elevated if you have leg cramps or low back pain.  Take warm sitz baths to soothe any pain or discomfort caused by hemorrhoids. Use hemorrhoid cream if your health care provider approves.  Wear a good support bra to prevent discomfort from breast tenderness.  If you develop varicose veins: ? Wear support pantyhose or compression stockings as told by your healthcare provider. ? Elevate your feet for 15 minutes, 3-4 times a day. Prenatal care  Write down your questions. Take them to your prenatal visits.  Keep all your prenatal visits as told by your health care provider. This is important. Safety  Wear your seat belt at all times when driving.  Make  a list of emergency phone numbers, including numbers for family, friends, the hospital, and police and fire departments. General instructions  Avoid cat litter boxes and soil used by cats. These carry germs that can cause birth defects in the baby. If you have a cat, ask someone to clean the litter box for you.  Do not travel far distances unless it is absolutely necessary and only with the approval of your health care provider.  Do not use hot tubs, steam rooms, or saunas.  Do not drink alcohol.  Do not use any products that contain nicotine or tobacco, such as cigarettes and e-cigarettes. If you need help quitting, ask your health care provider.  Do not use any medicinal herbs or unprescribed drugs. These chemicals affect the formation and growth of the baby.  Do not douche or use tampons or scented sanitary pads.  Do not cross your legs for long periods of time.  To prepare for the arrival of your baby: ? Take prenatal classes to understand, practice, and ask questions about labor and delivery. ? Make a trial run to the hospital. ? Visit the hospital and tour the maternity area. ? Arrange for maternity or paternity leave through employers. ? Arrange for family and friends to take care of pets while you are in the hospital. ? Purchase a rear-facing car seat and make sure you know how to install it in your car. ? Pack your hospital bag. ? Prepare the baby's nursery. Make sure to remove all pillows and stuffed animals from the baby's crib to prevent suffocation.  Visit your dentist if you have not gone during your pregnancy. Use a soft toothbrush to brush your teeth and be gentle when you floss. Contact a health care provider if:  You are unsure if you are in labor or if your water has broken.  You become dizzy.  You have mild pelvic cramps, pelvic pressure, or nagging pain in your abdominal area.  You have lower back pain.  You have persistent nausea, vomiting, or diarrhea.   You have an unusual or bad smelling vaginal discharge.  You have pain when you urinate. Get help right away if:  Your water breaks before 37 weeks.  You have regular contractions less than 5 minutes apart before 37 weeks.  You have a fever.  You are leaking fluid from your vagina.  You have spotting or bleeding from your vagina.  You have severe abdominal pain or cramping.  You have rapid weight loss or weight gain.  You have   shortness of breath with chest pain.  You notice sudden or extreme swelling of your face, hands, ankles, feet, or legs.  Your baby makes fewer than 10 movements in 2 hours.  You have severe headaches that do not go away when you take medicine.  You have vision changes. Summary  The third trimester is from week 28 through week 40, months 7 through 9. The third trimester is a time when the unborn baby (fetus) is growing rapidly.  During the third trimester, your discomfort may increase as you and your baby continue to gain weight. You may have abdominal, leg, and back pain, sleeping problems, and an increased need to urinate.  During the third trimester your breasts will keep growing and they will continue to become tender. A yellow fluid (colostrum) may leak from your breasts. This is the first milk you are producing for your baby.  False labor is a condition in which you feel small, irregular tightenings of the muscles in the womb (contractions) that eventually go away. These are called Braxton Hicks contractions. Contractions may last for hours, days, or even weeks before true labor sets in.  Signs of labor can include: abdominal cramps; regular contractions that start at 10 minutes apart and become stronger and more frequent with time; watery or bloody mucus discharge that comes from the vagina; increased pelvic pressure and dull back pain; and leaking of amniotic fluid. This information is not intended to replace advice given to you by your health  care provider. Make sure you discuss any questions you have with your health care provider. Document Released: 11/23/2001 Document Revised: 03/22/2019 Document Reviewed: 01/04/2017 [employer] Patient Education  2020 [employer]  

## 2019-10-05 NOTE — Progress Notes (Signed)
    Routine Prenatal Care Visit  Subjective  Amy Sullivan is a 23 y.o. G1P0000 at [redacted]w[redacted]d being seen today for ongoing prenatal care.  She is currently monitored for the following issues for this low-risk pregnancy and has Encounter for supervision of normal first pregnancy in first trimester and Vomiting affecting pregnancy on their problem list.  ----------------------------------------------------------------------------------- Patient reports that heartburn has improved with Pepcid. Still taking Promethazine to prevent vomiting. Contractions: Not present. Vag. Bleeding: None.  Movement: Present. No leaking of fluid.  ----------------------------------------------------------------------------------- The following portions of the patient's history were reviewed and updated as appropriate: allergies, current medications, past family history, past medical history, past social history, past surgical history and problem list. Problem list updated.  Objective  Blood pressure 110/60, weight 142 lb (64.4 kg), last menstrual period 03/18/2019. Pregravid weight 125 lb (56.7 kg) Total Weight Gain 17 lb (7.711 kg)  Fetal Status: Fetal Heart Rate (bpm): 135 Fundal Height: 27 cm Movement: Present     General:  Alert, oriented and cooperative. Patient is in no acute distress.  Skin: Skin is warm and dry. No rash noted.   Cardiovascular: Normal heart rate noted  Respiratory: Normal respiratory effort, no problems with respiration noted  Abdomen: Soft, gravid, appropriate for gestational age. Pain/Pressure: Absent     Pelvic:  Cervical exam deferred        Extremities: Normal range of motion.  Edema: None  Mental Status: Normal mood and affect. Normal behavior. Normal judgment and thought content.     Assessment   23 y.o. G1P0000 at [redacted]w[redacted]d, EDD 12/23/2019 by Last Menstrual Period presenting for a routine prenatal visit.  Plan   Pregnancy #1 Problems (from 03/18/19 to present)    Problem  Noted Resolved   Encounter for supervision of normal first pregnancy in first trimester 05/25/2019 by Homero Fellers, MD No   Overview Addendum 08/24/2019  9:39 AM by Gae Dry, MD      Clinic [facility] Prenatal Labs  Dating LMP=10wk [place] Blood type: A/Positive/-- (06/12 1030)   Genetic Screen NIPS:  Normal XX  Antibody:Negative (06/12 1030)  Anatomic [place] Complete 07/27/2019 Rubella: 3.85 (06/12 1030) Varicella: equivocal   GTT 28 wk:      RPR: Non Reactive (06/12 1030)   Rhogam  Not needed HBsAg: Negative (06/12 1030)   TDaP vaccine        due               HIV: Non Reactive (06/12 1030)   Flu Shot       due                         GBS:   Contraception    pill Pap:NIL 2020  CBB     no   CS/VBAC    n/a   Baby Food    breast   Support Person     mother    Fob not involved              Please refer to After Visit Summary for other counseling recommendations.   Return in about 2 weeks (around 10/19/2019) for ROB.  [name], CNM 10/05/2019  11:11 AM

## 2019-10-19 ENCOUNTER — Other Ambulatory Visit: Payer: Self-pay

## 2019-10-19 ENCOUNTER — Ambulatory Visit (INDEPENDENT_AMBULATORY_CARE_PROVIDER_SITE_OTHER): Payer: PRIVATE HEALTH INSURANCE | Admitting: Certified Nurse Midwife

## 2019-10-19 VITALS — BP 96/68 | Wt 142.0 lb

## 2019-10-19 DIAGNOSIS — O26843 Uterine size-date discrepancy, third trimester: Secondary | ICD-10-CM

## 2019-10-19 DIAGNOSIS — Z3A30 30 weeks gestation of pregnancy: Secondary | ICD-10-CM

## 2019-10-19 DIAGNOSIS — Z3400 Encounter for supervision of normal first pregnancy, unspecified trimester: Secondary | ICD-10-CM

## 2019-10-19 LAB — POCT URINALYSIS DIPSTICK OB: Glucose, UA: NEGATIVE

## 2019-10-19 NOTE — Progress Notes (Signed)
No concerns.rj 

## 2019-11-01 NOTE — Progress Notes (Signed)
ROB at 30wk5d: Good FM. No regular contractions, vaginal bleeding or leakage of fluid Explained benefit ofTDAP in pregnancy and also discussed blood transfusion consent She would like to talk with her mother before signing blood transfusion consent and getting consent for BT Lagging FH Encouraged childbirth classes on line ROB/ growth scan and TDAP next visit.  Dalia Heading, CNM

## 2019-11-02 ENCOUNTER — Other Ambulatory Visit: Payer: Self-pay

## 2019-11-02 ENCOUNTER — Other Ambulatory Visit: Payer: Self-pay | Admitting: Advanced Practice Midwife

## 2019-11-02 ENCOUNTER — Ambulatory Visit (INDEPENDENT_AMBULATORY_CARE_PROVIDER_SITE_OTHER): Payer: PRIVATE HEALTH INSURANCE

## 2019-11-02 ENCOUNTER — Ambulatory Visit (INDEPENDENT_AMBULATORY_CARE_PROVIDER_SITE_OTHER): Payer: PRIVATE HEALTH INSURANCE | Admitting: Advanced Practice Midwife

## 2019-11-02 ENCOUNTER — Encounter: Payer: Self-pay | Admitting: Advanced Practice Midwife

## 2019-11-02 VITALS — BP 112/78 | Wt 140.0 lb

## 2019-11-02 DIAGNOSIS — Z3A32 32 weeks gestation of pregnancy: Secondary | ICD-10-CM

## 2019-11-02 DIAGNOSIS — O365930 Maternal care for other known or suspected poor fetal growth, third trimester, not applicable or unspecified: Secondary | ICD-10-CM

## 2019-11-02 DIAGNOSIS — Z3400 Encounter for supervision of normal first pregnancy, unspecified trimester: Secondary | ICD-10-CM

## 2019-11-02 DIAGNOSIS — O26843 Uterine size-date discrepancy, third trimester: Secondary | ICD-10-CM

## 2019-11-02 DIAGNOSIS — Z3403 Encounter for supervision of normal first pregnancy, third trimester: Secondary | ICD-10-CM

## 2019-11-02 DIAGNOSIS — O365990 Maternal care for other known or suspected poor fetal growth, unspecified trimester, not applicable or unspecified: Secondary | ICD-10-CM

## 2019-11-02 LAB — POCT URINALYSIS DIPSTICK OB
Glucose, UA: NEGATIVE
POC,PROTEIN,UA: NEGATIVE

## 2019-11-02 NOTE — Progress Notes (Addendum)
Routine Prenatal Care Visit  Subjective  Amy Sullivan is a 23 y.o. G1P0000 at [redacted]w[redacted]d being seen today for ongoing prenatal care.  She is currently monitored for the following issues for this low-risk pregnancy and has Supervision of normal first pregnancy; Vomiting affecting pregnancy; and Pregnancy affected by fetal growth restriction on their problem list.  ----------------------------------------------------------------------------------- Patient reports mild constipation. Reviewed comfort measures. We discussed results of growth scan, BPP, dopplers, small AC.  She admits quitting cigarettes at 3 months GA and quitting MJ about 3 weeks ago. She also admits decreased caloric intake. We discussed the importance of adequate and healthy calories to grow a baby. Will follow up with repeat AFI/dopplers and NST at next visit.  Contractions: Not present. Vag. Bleeding: None.  Movement: Present. Leaking Fluid denies.  ----------------------------------------------------------------------------------- The following portions of the patient's history were reviewed and updated as appropriate: allergies, current medications, past family history, past medical history, past social history, past surgical history and problem list. Problem list updated.  Objective  Blood pressure 112/78, weight 140 lb (63.5 kg), last menstrual period 03/18/2019. Pregravid weight 125 lb (56.7 kg) Total Weight Gain 15 lb (6.804 kg) Urinalysis: Urine Protein Negative  Urine Glucose Negative  Fetal Status: Fetal Heart Rate (bpm): 137 Fundal Height: 30 cm Movement: Present       Patient Name: Amy Sullivan DOB: 12-20-1995 MRN: [id number]  ULTRASOUND REPORT  Location: [facility] Date of Service: 11/02/2019   Indications:growth/afi, U/A dopplers, And BPP Findings:  Amy Sullivan intrauterine pregnancy is visualized with FHR at 129 BPM. Biometrics give an (U/S) Gestational age of [redacted]w[redacted]d and an (U/S) EDD of  12/29/2019; this correlates with the clinically established Estimated Date of Delivery: 12/23/19.  Fetal presentation is Breech.  Placenta: fundal. Grade: 2 AFI: 9.8 cm  Growth percentile is 27.3% AC is 5.8%. EFW: 1781 g  ( 3 lb 15 oz )  Indications: Biophysical Profile performed for indication of AC 5.8%  BPP Scoring: Movement: 2/2  Tone: 2/2  Breathing: 2/2  AFI: 2/2  Systolic and Diastolic blood flow in each umbilical artery appear normal and without reversal or absence of diastolic flow.   The maximum S/D ratio is 2.79.   According to perinatology.com, this ratio is normal for this gestational age.  Impression: 1. [redacted]w[redacted]d Viable Singleton Intrauterine pregnancy dated by previously established criteria. 2. AFI is 9.8 cm.  3. BPP is 8/8 4. Normal U/A dopplers  Recommendations: 1.Clinical correlation with the patient's History and Physical Exam.   General:  Alert, oriented and cooperative. Patient is in no acute distress.  Skin: Skin is warm and dry. No rash noted.   Cardiovascular: Normal heart rate noted  Respiratory: Normal respiratory effort, no problems with respiration noted  Abdomen: Soft, gravid, appropriate for gestational age. Pain/Pressure: Absent     Pelvic:  Cervical exam deferred        Extremities: Normal range of motion.  Edema: None  Mental Status: Normal mood and affect. Normal behavior. Normal judgment and thought content.   Assessment   23 y.o. G1P0000 at [redacted]w[redacted]d by  12/23/2019, by Last Menstrual Period presenting for routine prenatal visit  Plan   Pregnancy #1 Problems (from 03/18/19 to present)    Problem Noted Resolved   Supervision of normal first pregnancy 05/25/2019 by Homero Fellers, MD No   Overview Addendum 08/24/2019  9:39 AM by Gae Dry, MD      [facility]inic [facility] Prenatal Labs  Dating LMP=10wk [place] Blood type: A/Positive/-- (06/12 1030)  Genetic Screen NIPS:  Normal XX  Antibody:Negative (06/12 1030)  Anatomic [place]  Complete 07/27/2019 Rubella: 3.85 (06/12 1030) Varicella: equivocal   GTT 28 wk:      RPR: Non Reactive (06/12 1030)   Rhogam  Not needed HBsAg: Negative (06/12 1030)   TDaP vaccine        due               HIV: Non Reactive (06/12 1030)   Flu Shot       due                         GBS:   Contraception    pill Pap:NIL 2020  CBB     no   CS/VBAC    n/a   Baby Food    breast   Support Person     mother    Fob not involved              Preterm labor symptoms and general obstetric precautions including but not limited to vaginal bleeding, contractions, leaking of fluid and fetal movement were reviewed in detail with the patient. Please refer to After Visit Summary for other counseling recommendations.   Return in about 2 weeks (around 11/16/2019) for afi/cord dopplers/nst/rob.  Tresea Mall, CNM 11/06/2019 9:06 AM

## 2019-11-02 NOTE — Addendum Note (Signed)
Addended by: [place], Sehaj Mcenroe B on: 11/02/2019 01:59 PM   Modules accepted: Orders

## 2019-11-02 NOTE — Patient Instructions (Signed)

## 2019-11-02 NOTE — Progress Notes (Signed)
ROB constipation 

## 2019-11-06 DIAGNOSIS — O365990 Maternal care for other known or suspected poor fetal growth, unspecified trimester, not applicable or unspecified: Secondary | ICD-10-CM | POA: Insufficient documentation

## 2019-11-19 ENCOUNTER — Encounter: Payer: Self-pay | Admitting: Advanced Practice Midwife

## 2019-11-19 ENCOUNTER — Ambulatory Visit (INDEPENDENT_AMBULATORY_CARE_PROVIDER_SITE_OTHER): Payer: PRIVATE HEALTH INSURANCE | Admitting: Advanced Practice Midwife

## 2019-11-19 ENCOUNTER — Other Ambulatory Visit: Payer: Self-pay | Admitting: Advanced Practice Midwife

## 2019-11-19 ENCOUNTER — Ambulatory Visit (INDEPENDENT_AMBULATORY_CARE_PROVIDER_SITE_OTHER): Payer: PRIVATE HEALTH INSURANCE

## 2019-11-19 ENCOUNTER — Other Ambulatory Visit: Payer: Self-pay

## 2019-11-19 VITALS — BP 120/80 | Wt 145.0 lb

## 2019-11-19 DIAGNOSIS — O321XX0 Maternal care for breech presentation, not applicable or unspecified: Secondary | ICD-10-CM | POA: Diagnosis not present

## 2019-11-19 DIAGNOSIS — O365930 Maternal care for other known or suspected poor fetal growth, third trimester, not applicable or unspecified: Secondary | ICD-10-CM | POA: Diagnosis not present

## 2019-11-19 DIAGNOSIS — Z3A35 35 weeks gestation of pregnancy: Secondary | ICD-10-CM | POA: Diagnosis not present

## 2019-11-19 DIAGNOSIS — Z3403 Encounter for supervision of normal first pregnancy, third trimester: Secondary | ICD-10-CM

## 2019-11-19 DIAGNOSIS — O365990 Maternal care for other known or suspected poor fetal growth, unspecified trimester, not applicable or unspecified: Secondary | ICD-10-CM

## 2019-11-19 DIAGNOSIS — O0993 Supervision of high risk pregnancy, unspecified, third trimester: Secondary | ICD-10-CM

## 2019-11-19 LAB — POCT URINALYSIS DIPSTICK OB
Glucose, UA: NEGATIVE
POC,PROTEIN,UA: NEGATIVE

## 2019-11-19 NOTE — Progress Notes (Signed)
Routine Prenatal Care Visit  Subjective  Amy Sullivan is a 23 y.o. G1P0000 at [redacted]w[redacted]d being seen today for ongoing prenatal care.  She is currently monitored for the following issues for this high-risk pregnancy and has Supervision of high-risk pregnancy; Vomiting affecting pregnancy; and Pregnancy affected by fetal growth restriction on their problem list.  ----------------------------------------------------------------------------------- Patient reports no complaints.  We discussed possibilities regarding delivery- version if needed, timing d/t fetal growth restriction, c/s if still breech. Contractions: Not present. Vag. Bleeding: None.  Movement: Present. Leaking Fluid denies.  ----------------------------------------------------------------------------------- The following portions of the patient's history were reviewed and updated as appropriate: allergies, current medications, past family history, past medical history, past social history, past surgical history and problem list. Problem list updated.  Objective  Blood pressure 120/80, weight 145 lb (65.8 kg), last menstrual period 03/18/2019. Pregravid weight 125 lb (56.7 kg) Total Weight Gain 20 lb (9.072 kg) Urinalysis: Urine Protein Negative  Urine Glucose Negative  Fetal Status: Fetal Heart Rate (bpm): 125 Fundal Height: 33 cm Movement: Present     NST: reactive 20 minute tracing, 125 bpm, moderate variability, +accelerations, -decelerations AFI: 7.4, dopplers wnl S/D ratio 2.6, breech, please see u/s report   General:  Alert, oriented and cooperative. Patient is in no acute distress.  Skin: Skin is warm and dry. No rash noted.   Cardiovascular: Normal heart rate noted  Respiratory: Normal respiratory effort, no problems with respiration noted  Abdomen: Soft, gravid, appropriate for gestational age. Pain/Pressure: Absent     Pelvic:  Cervical exam deferred        Extremities: Normal range of motion.  Edema: None  Mental  Status: Normal mood and affect. Normal behavior. Normal judgment and thought content.   Assessment   23 y.o. G1P0000 at [redacted]w[redacted]d by  12/23/2019, by Last Menstrual Period presenting for routine prenatal visit  Plan   Pregnancy #1 Problems (from 03/18/19 to present)    Problem Noted Resolved   Pregnancy affected by fetal growth restriction 11/06/2019 by Tresea Mall, CNM No   Supervision of high-risk pregnancy 05/25/2019 by Natale Milch, MD No   Overview Addendum 08/24/2019  9:39 AM by Nadara Mustard, MD      [facility]inic [facility] Prenatal Labs  Dating LMP=10wk [place] Blood type: A/Positive/-- (06/12 1030)   Genetic Screen NIPS:  Normal XX  Antibody:Negative (06/12 1030)  Anatomic [place] Complete 07/27/2019 Rubella: 3.85 (06/12 1030) Varicella: equivocal   GTT 28 wk:      RPR: Non Reactive (06/12 1030)   Rhogam  Not needed HBsAg: Negative (06/12 1030)   TDaP vaccine        due               HIV: Non Reactive (06/12 1030)   Flu Shot       due                         GBS:   Contraception    pill Pap:NIL 2020  CBB     no   CS/VBAC    n/a   Baby Food    breast   Support Person     mother    Fob not involved              Preterm labor symptoms and general obstetric precautions including but not limited to vaginal bleeding, contractions, leaking of fluid and fetal movement were reviewed in detail with the patient. Please refer to After Visit  Summary for other counseling recommendations.   Return in about 1 week (around 11/26/2019) for growth, afi, cord dopplers, nst, rob with MD.  Rod Can, CNM 11/19/2019 10:51 AM     Patient Name: Amy Sullivan DOB: 11-Feb-1996 MRN: [id number]  ULTRASOUND REPORT  Location: [facility] Date of Service: 11/19/2019   Indications:AFI Findings:  Nelda Marse[place] intrauterine pregnancy is visualized with FHR at 122 BPM.  Fetal presentation is Breech.  Placenta: fundal. Grade: 3 AFI: 7.4 cm  Umbilical Artery Dopplers were performed due to  fetal growth restriction and low AFI.   Systolic and Diastolic blood flow in each umbilical artery appear normal and without reversal or absence of diastolic flow.   The maximum S/D ratio is 2.6.   According to perinatology.com, this ratio is normal for this gestational age.   Impression: 1. [redacted]w[redacted]d Viable Singleton Intrauterine pregnancy dated by previously established criteria. 2. AFI is 7.4 cm.  3. U/A dopplers are normal.   Recommendations: 1.Clinical correlation with the patient's History and Physical Exam.  Gweneth Dimitri, RT

## 2019-11-19 NOTE — Patient Instructions (Signed)

## 2019-11-26 ENCOUNTER — Ambulatory Visit (INDEPENDENT_AMBULATORY_CARE_PROVIDER_SITE_OTHER): Payer: PRIVATE HEALTH INSURANCE

## 2019-11-26 ENCOUNTER — Encounter: Payer: Self-pay | Admitting: Obstetrics and Gynecology

## 2019-11-26 ENCOUNTER — Encounter: Payer: PRIVATE HEALTH INSURANCE | Admitting: Obstetrics & Gynecology

## 2019-11-26 ENCOUNTER — Other Ambulatory Visit (HOSPITAL_COMMUNITY)
Admission: RE | Admit: 2019-11-26 | Discharge: 2019-11-26 | Disposition: A | Discharge status: Home / Self Care | Payer: PRIVATE HEALTH INSURANCE | Source: Ambulatory Visit | Attending: Obstetrics and Gynecology | Admitting: Obstetrics and Gynecology

## 2019-11-26 ENCOUNTER — Other Ambulatory Visit: Payer: Self-pay

## 2019-11-26 ENCOUNTER — Other Ambulatory Visit: Payer: Self-pay | Admitting: Obstetrics & Gynecology

## 2019-11-26 ENCOUNTER — Ambulatory Visit (INDEPENDENT_AMBULATORY_CARE_PROVIDER_SITE_OTHER): Payer: PRIVATE HEALTH INSURANCE | Admitting: Obstetrics and Gynecology

## 2019-11-26 ENCOUNTER — Ambulatory Visit: Payer: PRIVATE HEALTH INSURANCE

## 2019-11-26 VITALS — BP 118/74 | Wt 149.0 lb

## 2019-11-26 DIAGNOSIS — Z3A36 36 weeks gestation of pregnancy: Secondary | ICD-10-CM

## 2019-11-26 DIAGNOSIS — O365990 Maternal care for other known or suspected poor fetal growth, unspecified trimester, not applicable or unspecified: Secondary | ICD-10-CM

## 2019-11-26 DIAGNOSIS — O0993 Supervision of high risk pregnancy, unspecified, third trimester: Secondary | ICD-10-CM | POA: Insufficient documentation

## 2019-11-26 DIAGNOSIS — O365930 Maternal care for other known or suspected poor fetal growth, third trimester, not applicable or unspecified: Secondary | ICD-10-CM

## 2019-11-26 DIAGNOSIS — O219 Vomiting of pregnancy, unspecified: Secondary | ICD-10-CM

## 2019-11-26 DIAGNOSIS — O321XX0 Maternal care for breech presentation, not applicable or unspecified: Secondary | ICD-10-CM

## 2019-11-26 NOTE — Progress Notes (Signed)
Routine Prenatal Care Visit  Subjective  Amy Sullivan is a 23 y.o. G1P0000 at [redacted]w[redacted]d being seen today for ongoing prenatal care.  She is currently monitored for the following issues for this high-risk pregnancy and has Supervision of high-risk pregnancy; Vomiting affecting pregnancy; and Pregnancy affected by fetal growth restriction on their problem list.  ----------------------------------------------------------------------------------- Patient reports no complaints.   Contractions: Not present. Vag. Bleeding: None.  Movement: Present. Leaking Fluid denies.  Growth u/s: normal overall growth, but AC < 5th %ile. AFI normal.  ----------------------------------------------------------------------------------- The following portions of the patient's history were reviewed and updated as appropriate: allergies, current medications, past family history, past medical history, past social history, past surgical history and problem list. Problem list updated.  Objective  Blood pressure 118/74, weight 149 lb (67.6 kg), last menstrual period 03/18/2019. Pregravid weight 125 lb (56.7 kg) Total Weight Gain 24 lb (10.9 kg) Urinalysis: Urine Protein    Urine Glucose    Fetal Status: Fetal Heart Rate (bpm): 120   Movement: Present  Presentation: Complete Breech  General:  Alert, oriented and cooperative. Patient is in no acute distress.  Skin: Skin is warm and dry. No rash noted.   Cardiovascular: Normal heart rate noted  Respiratory: Normal respiratory effort, no problems with respiration noted  Abdomen: Soft, gravid, appropriate for gestational age. Pain/Pressure: Absent     Pelvic:  Cervical exam deferred        Extremities: Normal range of motion.  Edema: None  Mental Status: Normal mood and affect. Normal behavior. Normal judgment and thought content.   NST: Baseline FHR: 120 beats/min Variability: moderate Accelerations: present Decelerations: absent Tocometry: not  done  Interpretation:  INDICATIONS: fetal growth restriction RESULTS:  A NST procedure was performed with FHR monitoring and a normal baseline established, appropriate time of 20-40 minutes of evaluation, and accels >2 seen w 15x15 characteristics.  Results show a REACTIVE NST.    Imaging Results US OB FU (growth, f/u anatomy) [facility]  Result Date: 11/26/2019 Patient Name: Amy Sullivan DOB: 1996-05-21 MRN: [id number] ULTRASOUND REPORT Location: [facility] Date of Service: 11/26/2019 Indications:growth/afi Findings: Amy Marse[place] intrauterine pregnancy is visualized with FHR at 119 BPM. Biometrics give an (U/S) Gestational age of [redacted]w[redacted]d and an (U/S) EDD of 01/02/2020; this correlates with the clinically established Estimated Date of Delivery: 12/23/2019. Fetal presentation is Breech. Placenta: fundal. Grade: 3 AFI: 8.84 cm (MVP > 2 cm) Growth percentile is 14.7. EFW: 2,370 g (5 lbs 4 oz), AC measures 3.1 %ile Umbilical Artery Dopplers were performed due to abnormal, fetal growth restriction and low amniotic fluid index Systolic and Diastolic blood flow in each umbilical artery appear normal and without reversal or absence of diastolic flow. The maximum S/D ratio is 2.26. According to perinatology.com, this ratio is normal for this gestational age. Impression: 1. [redacted]w[redacted]d Viable Singleton Intrauterine pregnancy previously established criteria. 2. Growth is 14.7 %ile (AC is 3.1%ile).  AFI is 8.84 cm. 3. Meets criteria fetal growth restriction according to the Practice Advisory: Updated Guidance Regarding Fetal Growth Restriction published September 2020. 4. Breech presentation Recommendations: 1. Continue fetal monitoring twice weekly with NST and weekly AFI with umbilical artery dopplers.  2. Delivery between [redacted]w[redacted]d-[redacted]w[redacted]d if no other abnormal findings. Jenine  M. Albertine Grates     RDMS The ultrasound images and findings were reviewed by me and I agree with the above report. Prentice Docker, MD, [facility],  [facility] 11/26/2019 9:07 AM     [place] UA Doppler Re-Eval  Result Date: 11/26/2019 Patient Name: Amy Sullivan DOB: 1996/09/08 MRN: [id number]  ULTRASOUND REPORT  Location: [facility] Date of Service: 11/26/2019  Indications:growth/afi Findings: Amy Sullivan intrauterine pregnancy is visualized with FHR at 119 BPM.  Biometrics give an (U/S) Gestational age of [redacted]w[redacted]d and an (U/S) EDD of 01/02/2020; this correlates with the clinically established Estimated Date of Delivery: 12/23/2019.  Fetal presentation is Breech. Placenta: fundal. Grade: 3 AFI: 8.84 cm (MVP > 2 cm)  Growth percentile is 14.7. EFW: 2,370 g (5 lbs 4 oz), AC measures 3.1 %ile  Umbilical Artery Dopplers were performed due to abnormal, fetal growth restriction and low amniotic fluid index  Systolic and Diastolic blood flow in each umbilical artery appear normal and without reversal or absence of diastolic flow.  The maximum S/D ratio is 2.26.  According to perinatology.com, this ratio is normal for this gestational age.  Impression: 1. [redacted]w[redacted]d Viable Singleton Intrauterine pregnancy previously established criteria. 2. Growth is 14.7 %ile (AC is 3.1%ile).  AFI is 8.84 cm. 3. Meets criteria fetal growth restriction according to the Practice Advisory: Updated Guidance Regarding Fetal Growth Restriction published September 2020. 4. Breech presentation  Recommendations: 1. Continue fetal monitoring twice weekly with NST and weekly AFI with umbilical artery dopplers.  2. Delivery between [redacted]w[redacted]d-[redacted]w[redacted]d if no other abnormal findings.  Jenine  M. Marciano Sequin     RDMS  The ultrasound images and findings were reviewed by me and I agree with the above report.  Thomasene Mohair, MD, [facility], [facility] 11/26/2019 9:07 AM       Assessment   23 y.o. G1P0000 at [redacted]w[redacted]d by  12/23/2019, by Last Menstrual Period presenting for routine prenatal visit  Plan   Pregnancy #1 Problems (from 03/18/19 to present)     Problem Noted Resolved   Pregnancy affected by fetal growth restriction 11/06/2019 by Tresea Mall, CNM No   Supervision of high-risk pregnancy 05/25/2019 by Natale Milch, MD No   Overview Addendum 08/24/2019  9:39 AM by Nadara Mustard, MD      Clinic [facility] Prenatal Labs  Dating LMP=10wk [place] Blood type: A/Positive/-- (06/12 1030)   Genetic Screen NIPS:  Normal XX  Antibody:Negative (06/12 1030)  Anatomic [place] Complete 07/27/2019 Rubella: 3.85 (06/12 1030) Varicella: equivocal   GTT 28 wk:      RPR: Non Reactive (06/12 1030)   Rhogam  Not needed HBsAg: Negative (06/12 1030)   TDaP vaccine        due               HIV: Non Reactive (06/12 1030)   Flu Shot       due                         GBS:   Contraception    pill Pap:NIL 2020  CBB     no   CS/VBAC    n/a   Baby Food    breast   Support Person     mother    Fob not involved              Preterm labor symptoms and general obstetric precautions including but not limited to vaginal bleeding, contractions, leaking of fluid and fetal movement were reviewed in detail with the patient. Please refer to After Visit Summary for other counseling recommendations.   - Discussed plan of care moving forward with twice weekly NSTs and weekly u/s for AFI with umbilical artery  dopplers. - Discussed ECV for breech. Discussed that I would recommend it be done next week.  She will consider and call with any follow up questions. - She states that if she did have to have a c-section, she would prefer to go under general anesthesia.  Discussed that I would discourage this and discussed reasons why.  She will continue to consider.  - Fetal movement counts discussed - GBS/Aptima today  Return in about 3 days (around 11/29/2019) for ROB w NST 3 days, 1 week get u/s for AFI with UA dopplers and ROB w NST.  Thomasene MohairStephen Clanton Emanuelson, MD, [facility] FrederickFACOG [facility], [facility] 11/26/2019 9:52 AM

## 2019-11-28 LAB — CERVICOVAGINAL ANCILLARY ONLY
Chlamydia: NEGATIVE
Comment: NEGATIVE
Comment: NEGATIVE
Comment: NORMAL
Neisseria Gonorrhea: NEGATIVE
Trichomonas: NEGATIVE

## 2019-11-28 LAB — STREP GP B NAA: Strep Gp B NAA: NEGATIVE

## 2019-11-30 ENCOUNTER — Ambulatory Visit (INDEPENDENT_AMBULATORY_CARE_PROVIDER_SITE_OTHER): Payer: PRIVATE HEALTH INSURANCE | Admitting: Advanced Practice Midwife

## 2019-11-30 ENCOUNTER — Encounter: Payer: Self-pay | Admitting: Advanced Practice Midwife

## 2019-11-30 ENCOUNTER — Other Ambulatory Visit: Payer: Self-pay

## 2019-11-30 ENCOUNTER — Telehealth: Payer: Self-pay | Admitting: Obstetrics and Gynecology

## 2019-11-30 VITALS — BP 100/60 | Wt 144.0 lb

## 2019-11-30 DIAGNOSIS — Z3A36 36 weeks gestation of pregnancy: Secondary | ICD-10-CM | POA: Diagnosis not present

## 2019-11-30 DIAGNOSIS — O365930 Maternal care for other known or suspected poor fetal growth, third trimester, not applicable or unspecified: Secondary | ICD-10-CM

## 2019-11-30 DIAGNOSIS — O0993 Supervision of high risk pregnancy, unspecified, third trimester: Secondary | ICD-10-CM

## 2019-11-30 NOTE — Telephone Encounter (Signed)
Patient wants ECV.  Will have procedure on either 12/22 at 0700 or 12/24 at noon. Will need to reschedule UA dopps for 12/22, if she is to choose that date. Will see if this is possible, otherwise, will need to do procedure on 12/24.

## 2019-11-30 NOTE — Progress Notes (Signed)
Routine Prenatal Care Visit  Subjective  Amy Sullivan is a 23 y.o. G1P0000 at 106w5d being seen today for ongoing prenatal care.  She is currently monitored for the following issues for this high-risk pregnancy and has Supervision of high-risk pregnancy; Vomiting affecting pregnancy; and Pregnancy affected by fetal growth restriction on their problem list.  ----------------------------------------------------------------------------------- Patient reports no complaints.  She is interested in having ECV. Contractions: Not present. Vag. Bleeding: None.  Movement: Present. Leaking Fluid denies.  ----------------------------------------------------------------------------------- The following portions of the patient's history were reviewed and updated as appropriate: allergies, current medications, past family history, past medical history, past social history, past surgical history and problem list. Problem list updated.  Objective  Blood pressure 100/60, weight 144 lb (65.3 kg), last menstrual period 03/18/2019. Pregravid weight 125 lb (56.7 kg) Total Weight Gain 19 lb (8.618 kg) Urinalysis: Urine Protein    Urine Glucose    Fetal Status: Fetal Heart Rate (bpm): 130 Fundal Height: 35 cm Movement: Present      NST: reactive 30 minute tracing, 130 bpm baseline, minimal to start with change to moderate variability, +accelerations to 150s, -decelerations.  General:  Alert, oriented and cooperative. Patient is in no acute distress.  Skin: Skin is warm and dry. No rash noted.   Cardiovascular: Normal heart rate noted  Respiratory: Normal respiratory effort, no problems with respiration noted  Abdomen: Soft, gravid, appropriate for gestational age. Pain/Pressure: Absent     Pelvic:  Cervical exam deferred        Extremities: Normal range of motion.  Edema: None  Mental Status: Normal mood and affect. Normal behavior. Normal judgment and thought content.   Assessment   23 y.o. G1P0000 at  [redacted]w[redacted]d by  12/23/2019, by Last Menstrual Period presenting for routine prenatal visit  Plan   Pregnancy #1 Problems (from 03/18/19 to present)    Problem Noted Resolved   Pregnancy affected by fetal growth restriction 11/06/2019 by Tresea Mall, CNM No   Supervision of high-risk pregnancy 05/25/2019 by Natale Milch, MD No   Overview Addendum 08/24/2019  9:39 AM by Nadara Mustard, MD      Clinic [facility] Prenatal Labs  Dating LMP=10wk [place] Blood type: A/Positive/-- (06/12 1030)   Genetic Screen NIPS:  Normal XX  Antibody:Negative (06/12 1030)  Anatomic [place] Complete 07/27/2019 Rubella: 3.85 (06/12 1030) Varicella: equivocal   GTT 28 wk:      RPR: Non Reactive (06/12 1030)   Rhogam  Not needed HBsAg: Negative (06/12 1030)   TDaP vaccine        due               HIV: Non Reactive (06/12 1030)   Flu Shot       due                         GBS:   Contraception    pill Pap:NIL 2020  CBB     no   CS/VBAC    n/a   Baby Food    breast   Support Person     mother    Fob not involved              Preterm labor symptoms and general obstetric precautions including but not limited to vaginal bleeding, contractions, leaking of fluid and fetal movement were reviewed in detail with the patient. Please refer to After Visit Summary for other counseling recommendations.  ECV is scheduled for next week.  Dr Glennon Mac is working on timing and will contact patient.  Return for has follow up scheduled.  Rod Can, CNM 11/30/2019 10:50 AM

## 2019-12-03 ENCOUNTER — Other Ambulatory Visit: Payer: Self-pay

## 2019-12-03 ENCOUNTER — Ambulatory Visit (INDEPENDENT_AMBULATORY_CARE_PROVIDER_SITE_OTHER): Payer: PRIVATE HEALTH INSURANCE

## 2019-12-03 DIAGNOSIS — O365990 Maternal care for other known or suspected poor fetal growth, unspecified trimester, not applicable or unspecified: Secondary | ICD-10-CM

## 2019-12-03 DIAGNOSIS — O365930 Maternal care for other known or suspected poor fetal growth, third trimester, not applicable or unspecified: Secondary | ICD-10-CM | POA: Diagnosis not present

## 2019-12-03 DIAGNOSIS — O0993 Supervision of high risk pregnancy, unspecified, third trimester: Secondary | ICD-10-CM

## 2019-12-03 DIAGNOSIS — Z3A37 37 weeks gestation of pregnancy: Secondary | ICD-10-CM

## 2019-12-04 ENCOUNTER — Encounter: Payer: Self-pay | Admitting: Obstetrics and Gynecology

## 2019-12-04 ENCOUNTER — Encounter: Payer: PRIVATE HEALTH INSURANCE | Admitting: Certified Nurse Midwife

## 2019-12-04 ENCOUNTER — Other Ambulatory Visit: Payer: PRIVATE HEALTH INSURANCE

## 2019-12-04 ENCOUNTER — Telehealth: Payer: Self-pay | Admitting: Obstetrics and Gynecology

## 2019-12-04 ENCOUNTER — Observation Stay
Admission: EM | Admit: 2019-12-04 | Discharge: 2019-12-04 | Disposition: A | Discharge status: Home / Self Care | Payer: PRIVATE HEALTH INSURANCE | Attending: Obstetrics and Gynecology | Admitting: Obstetrics and Gynecology

## 2019-12-04 ENCOUNTER — Other Ambulatory Visit: Payer: Self-pay

## 2019-12-04 DIAGNOSIS — Z87891 Personal history of nicotine dependence: Secondary | ICD-10-CM | POA: Insufficient documentation

## 2019-12-04 DIAGNOSIS — Z79899 Other long term (current) drug therapy: Secondary | ICD-10-CM | POA: Diagnosis not present

## 2019-12-04 DIAGNOSIS — O365990 Maternal care for other known or suspected poor fetal growth, unspecified trimester, not applicable or unspecified: Secondary | ICD-10-CM | POA: Diagnosis present

## 2019-12-04 DIAGNOSIS — O321XX0 Maternal care for breech presentation, not applicable or unspecified: Secondary | ICD-10-CM | POA: Diagnosis not present

## 2019-12-04 DIAGNOSIS — O365930 Maternal care for other known or suspected poor fetal growth, third trimester, not applicable or unspecified: Secondary | ICD-10-CM | POA: Insufficient documentation

## 2019-12-04 DIAGNOSIS — Z3A37 37 weeks gestation of pregnancy: Secondary | ICD-10-CM | POA: Diagnosis not present

## 2019-12-04 DIAGNOSIS — O329XX0 Maternal care for malpresentation of fetus, unspecified, not applicable or unspecified: Secondary | ICD-10-CM | POA: Diagnosis present

## 2019-12-04 DIAGNOSIS — O0990 Supervision of high risk pregnancy, unspecified, unspecified trimester: Secondary | ICD-10-CM

## 2019-12-04 DIAGNOSIS — O0993 Supervision of high risk pregnancy, unspecified, third trimester: Secondary | ICD-10-CM

## 2019-12-04 LAB — CBC
HCT: 29.6 % — ABNORMAL LOW (ref 36.0–46.0)
Hemoglobin: 10.7 g/dL — ABNORMAL LOW (ref 12.0–15.0)
MCH: 30.7 pg (ref 26.0–34.0)
MCHC: 36.1 g/dL — ABNORMAL HIGH (ref 30.0–36.0)
MCV: 84.8 fL (ref 80.0–100.0)
Platelets: 229 10*3/uL (ref 150–400)
RBC: 3.49 MIL/uL — ABNORMAL LOW (ref 3.87–5.11)
RDW: 12 % (ref 11.5–15.5)
WBC: 8.9 10*3/uL (ref 4.0–10.5)
nRBC: 0 % (ref 0.0–0.2)

## 2019-12-04 LAB — TYPE AND SCREEN
ABO/RH(D): A POS
Antibody Screen: NEGATIVE

## 2019-12-04 MED ORDER — LIDOCAINE HCL (PF) 1 % IJ SOLN: 30.0000 mL | INTRAMUSCULAR | Status: DC | PRN

## 2019-12-04 MED ORDER — TERBUTALINE SULFATE 1 MG/ML IJ SOLN
0.2500 mg | Freq: Once | INTRAMUSCULAR | Status: AC
  Administered 2019-12-04: 0.25 mg via SUBCUTANEOUS
  Filled 2019-12-04: qty 1

## 2019-12-04 MED ORDER — LACTATED RINGERS IV SOLN: INTRAVENOUS | Status: DC

## 2019-12-04 MED ORDER — FENTANYL CITRATE (PF) 100 MCG/2ML IJ SOLN
50.0000 ug | Freq: Once | INTRAMUSCULAR | Status: AC
  Administered 2019-12-04: 50 ug via INTRAVENOUS
  Filled 2019-12-04: qty 2

## 2019-12-04 MED ORDER — MINERAL OIL LIGHT OIL
1.0000 "application " | TOPICAL_OIL | Freq: Once | Status: AC
  Administered 2019-12-04: 1 via TOPICAL
  Filled 2019-12-04 (×2): qty 30

## 2019-12-04 NOTE — Telephone Encounter (Signed)
Patient is aware of H&P at [facility] on 12/30 @ 8:50am w/ Dr. Glennon Mac, Pre-admit testing to be scheduled, COVID testing on 12/31, and OR on 12/18/19. Patient is aware to quarantine after COVID testing. Patient is aware she still needs to come to her appointments on 12/29 for AFI/NST/ROB.

## 2019-12-04 NOTE — Progress Notes (Signed)
Pt presents to L&D for a external cephalic version with Marisue Brooklyn, MD

## 2019-12-04 NOTE — Telephone Encounter (Signed)
-----   Message from Will Bonnet, MD sent at 12/04/2019  9:27 AM EST ----- Regarding: Schedule surgery Surgery Booking Request Patient Full Name:  Amy Sullivan  MRN: [id number]  DOB: Jul 24, 1996  Surgeon: Prentice Docker, MD  Requested Surgery Date and Time: 12/17/2018 to follow hysterectomy Primary Diagnosis AND Code: Malpresentation of fetus (O32.9XX0) Secondary Diagnosis and Code:  Surgical Procedure: Cesarean Section L&D Notification: Yes Admission Status: surgery admit Length of Surgery: 60 minutes Special Case Needs: No H&P: Yes Phone Interview???:  Yes Interpreter: No Language:  Medical Clearance:  No Special Scheduling Instructions: Should follows hysterectomy on schedule, unless one of the two c-sections from that day deliver prior to the date of surgery. In that case, she should precede the hysterectomy. Any known health/anesthesia issues, diabetes, sleep apnea, latex allergy, defibrillator/pacemaker?: No Acuity: P2   (P1 highest, P2 delay may cause harm, P3 low, elective gyn, P4 lowest)

## 2019-12-04 NOTE — Procedures (Addendum)
The risks, benefits, and alternatives of the procedure were reviewed with the patient.  Greater than 1 hour of fetal monitoring with category 1 tracing obtained prior to procedure.  Primary Obstetrician for the procedure: Prentice Docker, MD  Assistant Obstetrician for the procedure: Adrian Prows, MD  The assistant physician was necessary due to the need for another skilled person to assist in the procedure.  Dr. Gilman Schmidt performed direct manipulation of the fetus, as well as ultrasound during the procedure to assess fetal positioning and monitoring during the procedure.   The patient already had an IV. She was given fentanyl 50 mcg IV x 1. She was given terbutaline 98mcg [place] x 1.  Using intermittent ultrasound for fetal positioning and heart rate monitoring, the fetal breech was attempted to be elevated from the pelvis.  A small amount of elevation was obtained with an attempted forward roll of the fetus x 3.  Reverse roll of the fetus was attempted unsuccessfully.  The fetal breech was never fully able to be elevated from the pelvis.  The procedure were terminated at this point.  The patient tolerated the procedure well.  No evidence of fetal distress, no vaginal bleeding, no rupture of membranes.  There was one short duration of time where there was very mild fetal bradycardia (to the 90s). This returned to baseline quickly and spontaneously when the procedure was paused for this reason.  No further episodes occurred.  Patient and fetus will be monitored for about 1-2 hours to ensure no labor and reassuring fetal status.  Fetal heart rate monitored intermittently by ultrasound and documented in EPIC at the time of monitoring.  Prentice Docker, MD 12/04/2019 11:51 AM

## 2019-12-04 NOTE — Discharge Summary (Signed)
Physician Discharge Summary  Patient ID: Amy Sullivan MRN: [id number] DOB/AGE: [birth year] 23 y.o.  Admit date: 12/04/2019 Admitting provider: Prentice Docker, MD  Discharge date: 12/04/2019   Admission Diagnoses:  1) Malpresentation of fetus before the onset of labor 2) fetal growth restriction 3) intrauterine pregnancy at [redacted]w[redacted]d   Discharge Diagnoses:  1) Malpresentation of fetus before the onset of labor 2) fetal growth restriction 3) intrauterine pregnancy at [redacted]w[redacted]d   History of Present Illness: The patient is a 23 y.o. female G1P0000 at [redacted]w[redacted]d who presents for external cephalic version.  See the H&P for full details. On admission she noted +FM, no LOF, no vaginal bleeding, no contractions.   Past Medical History:  Diagnosis Date  . 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 Pink eye     Past Surgical History:  Procedure Laterality Date  . WISDOM TOOTH EXTRACTION      No current facility-administered medications on file prior to encounter.   Current Outpatient Medications on File Prior to Encounter  Medication Sig Dispense Refill  . Multiple Vitamin (MULTIVITAMIN) tablet Take 1 tablet by mouth daily.    . promethazine (PHENERGAN) 25 MG tablet Take 1 tablet (25 mg total) by mouth every 6 (six) hours as needed for nausea or vomiting. 30 tablet 2    No Known Allergies  Social History   Socioeconomic History  . Marital status: Single    Spouse name: Not on file  . Number of children: Not on file  . Years of education: Not on file  . Highest education level: Not on file  Occupational History  . Not on file  Tobacco Use  . Smoking status: Former Smoker    Packs/day: 0.00    Types: Cigarettes  . Smokeless tobacco: Never Used  . Tobacco comment: quit at 3 months pregnant  Substance and Sexual Activity  . Alcohol use: Yes    Comment: 5-6 beers a day, she would drink all day   . Drug use: Yes    Types: Marijuana    Comment: quit at [redacted] wks gestational age  .  Sexual activity: Yes    Birth control/protection: None  Other Topics Concern  . Not on file  Social History Narrative  . Not on file   Social Determinants of Health   Financial Resource Strain:   . Difficulty of Paying Living Expenses: Not on file  Food Insecurity:   . Worried About Charity fundraiser in the Last Year: Not on file  . Ran Out of Food in the Last Year: Not on file  Transportation Needs:   . Lack of Transportation (Medical): Not on file  . Lack of Transportation (Non-Medical): Not on file  Physical Activity:   . Days of Exercise per Week: Not on file  . Minutes of Exercise per Session: Not on file  Stress:   . Feeling of Stress : Not on file  Social Connections:   . Frequency of Communication with Friends and Family: Not on file  . Frequency of Social Gatherings with Friends and Family: Not on file  . Attends Religious Services: Not on file  . Active Member of Clubs or Organizations: Not on file  . Attends Archivist Meetings: Not on file  . Marital Status: Not on file  Intimate Partner Violence:   . Fear of Current or Ex-Partner: Not on file  . Emotionally Abused: Not on file  . Physically Abused: Not on file  . Sexually Abused: Not  on file    Family History  Problem Relation Age of Onset  . Cancer Maternal Grandmother 40       cyst or cancer not sure     Review of Systems  Constitutional: Negative.   HENT: Negative.   Eyes: Negative.   Respiratory: Negative.   Cardiovascular: Negative.   Gastrointestinal: Positive for abdominal pain (mild soreness after procedure (typical of this type of procedure)). Negative for blood in stool, constipation, diarrhea, nausea and vomiting.  Genitourinary: Negative.   Musculoskeletal: Negative.   Skin: Negative.   Neurological: Negative.   Psychiatric/Behavioral: Negative.      Physical Exam: BP 119/79 (BP Location: Right Arm)   Pulse 72   Temp 98.3 F (36.8 C) (Oral)   Resp 18   Ht 5\' 2"  (1.575  m)   Wt 65.3 kg   LMP 03/18/2019 (Exact Date)   SpO2 97%   BMI 26.34 kg/m   Physical Exam Constitutional:      General: She is not in acute distress.    Appearance: Normal appearance.  HENT:     Head: Normocephalic and atraumatic.  Eyes:     General: No scleral icterus.    Conjunctiva/sclera: Conjunctivae normal.  Abdominal:     Comments: Gravid, NT  Neurological:     General: No focal deficit present.     Mental Status: She is alert and oriented to person, place, and time.     Cranial Nerves: No cranial nerve deficit.  Psychiatric:        Mood and Affect: Mood normal.        Behavior: Behavior normal.        Judgment: Judgment normal.     Consults: None  Significant Findings/ Diagnostic Studies: none  Procedures:  NST - just prior to discharge Baseline FHR: 125 beats/min Variability: moderate Accelerations: present Decelerations: absent Tocometry: infrequent  Interpretation:  INDICATIONS: status post attempted external cephalic version RESULTS:  A NST procedure was performed with FHR monitoring and a normal baseline established, appropriate time of 20-40 minutes of evaluation, and accels >2 seen w 15x15 characteristics.  Results show a REACTIVE NST.    Procedure 2: Attempted external cephalic version  Hospital Course: The patient was admitted to Labor and Delivery Triage for observation. She had normal vital signs (her initial BP was slightly elevated, but was normal for all other checks).  The fetus was monitored for 1 hour prior to the procedure. The ECV was attempted and was unsuccessful.  The fetus was monitored for > 2 hours after the procedure and had a category 1 tracing the entire time.  She had a c-section scheduled for 12/18/2019 due to unsuccessful version.  She will be followed for fetal growth restriction and we discussed that delivery prior to this date might be medically necessary.   Discharge Condition: stable  Disposition: Discharge disposition:  01-Home or Self Care       Diet: Regular diet  Discharge Activity: Activity as tolerated   Allergies as of 12/04/2019   No Known Allergies     Medication List    TAKE these medications   multivitamin tablet Take 1 tablet by mouth daily.   promethazine 25 MG tablet Commonly known as: PHENERGAN Take 1 tablet (25 mg total) by mouth every 6 (six) hours as needed for nausea or vomiting.        Total time spent taking care of this patient: 60 minutes  Signed: 12/06/2019, MD  12/04/2019, 11:53 AM

## 2019-12-04 NOTE — Progress Notes (Addendum)
Pt discharge home per S.Jackson, MD. Pt given AVS and discharge instructions. S.Jackson, MD reviewed fetal strip. Pt received labor precautions and cesarean education. Pt verbalized understanding. Pt scheduled for a c/s on 12/18/2019 with S.Jackson, MD. Pt in stable condition and discharge home with mom Lovena Le) via personal vehicle.

## 2019-12-04 NOTE — H&P (Signed)
OB History & Physical   History of Present Illness:  Chief Complaint: presents for external cephalic version  HPI:  Amy Sullivan is a 23 y.o. G1P0000 female at [redacted]w[redacted]d dated by her LMP consistent with a 10 week ultrasound.  Her pregnancy has been complicated by fetal growth restriction (AC<10th %ile).    She denies contractions.   She denies leakage of fluid.   She denies vaginal bleeding.   She reports fetal movement.    She presents today for an attempted external cephalic version due to noted breech presentation.    Total weight gain for pregnancy: 8.618 kg   Obstetrical Problem List: Pregnancy #1 Problems (from 03/18/19 to present)    Problem Noted Resolved   Malpresentation of fetus, antepartum 12/04/2019 by Will Bonnet, MD No   Pregnancy affected by fetal growth restriction 11/06/2019 by Rod Can, CNM No   Supervision of high-risk pregnancy 05/25/2019 by Homero Fellers, MD No   Overview Addendum 08/24/2019  9:39 AM by Gae Dry, MD      Clinic [facility] Prenatal Labs  Dating LMP=10wk [place] Blood type: A/Positive/-- (06/12 1030)   Genetic Screen NIPS:  Normal XX  Antibody:Negative (06/12 1030)  Anatomic [place] Complete 07/27/2019 Rubella: 3.85 (06/12 1030) Varicella: equivocal   GTT 28 wk:      RPR: Non Reactive (06/12 1030)   Rhogam  Not needed HBsAg: Negative (06/12 1030)   TDaP vaccine        due               HIV: Non Reactive (06/12 1030)   Flu Shot       due                         GBS:   Contraception    pill Pap:NIL 2020  CBB     no   CS/VBAC    n/a   Baby Food    breast   Support Person     mother    Fob not involved            Maternal Medical History:   Past Medical History:  Diagnosis Date  . 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 Pink eye     Past Surgical History:  Procedure Laterality Date  . WISDOM TOOTH EXTRACTION      No Known Allergies  Prior to Admission medications   Medication Sig Start Date End Date Taking?  Authorizing Provider  Multiple Vitamin (MULTIVITAMIN) tablet Take 1 tablet by mouth daily.   Yes [provider]  promethazine (PHENERGAN) 25 MG tablet Take 1 tablet (25 mg total) by mouth every 6 (six) hours as needed for nausea or vomiting. 10/05/19  Yes Rexene Agent, CNM    OB History  Gravida Para Term Preterm AB Living  1 0 0 0 0 0  SAB TAB Ectopic Multiple Live Births  0 0 0 0 0    # Outcome Date GA Lbr Len/2nd Weight Sex Delivery Anes PTL Lv  1 Current             Prenatal care site: [facility]  Social History: She  reports that she has quit smoking. Her smoking use included cigarettes. She smoked 0.00 packs per day. She has never used smokeless tobacco. She reports current alcohol use. She reports current drug use. Drug: Marijuana.  Family History: family history includes Cancer (age of onset: 35) in her  maternal grandmother.   Review of Systems:  Review of Systems  Constitutional: Negative.   HENT: Negative.   Eyes: Negative.   Respiratory: Negative.   Cardiovascular: Negative.   Gastrointestinal: Negative.   Genitourinary: Negative.   Musculoskeletal: Negative.   Skin: Negative.   Neurological: Negative.   Psychiatric/Behavioral: Negative.      Physical Exam:  BP 119/79 (BP Location: Right Arm)   Pulse 72   Temp 98.3 F (36.8 C) (Oral)   Resp 18   Ht 5\' 2"  (1.575 m)   Wt 65.3 kg   LMP 03/18/2019 (Exact Date)   SpO2 99%   BMI 26.34 kg/m   Physical Exam Constitutional:      General: She is not in acute distress.    Appearance: Normal appearance.  HENT:     Head: Normocephalic and atraumatic.  Eyes:     General: No scleral icterus.    Conjunctiva/sclera: Conjunctivae normal.  Abdominal:     Comments: Gravid, NT  Neurological:     General: No focal deficit present.     Mental Status: She is alert and oriented to person, place, and time.     Cranial Nerves: No cranial nerve deficit.  Psychiatric:        Mood and Affect: Mood  normal.        Behavior: Behavior normal.        Judgment: Judgment normal.      Pertinent Results:   Baseline FHR: 125 beats/min   Variability: moderate   Accelerations: present   Decelerations: absent Contractions: absent frequency: n/a Overall assessment: category 1  Bedside Ultrasound:  Number of Fetus: 1  Presentation: complete breech  Fluid: subjectively normal, DVP > 2 cm, also noted  Placental Location: fundal  Assessment:  Amy Sullivan is a 23 y.o. G13P0000 female at [redacted]w[redacted]d with malpresentation of fetus (complete breech) here for external cephalic version.   Plan:  1. Admit to Labor & Delivery  2. CBC, T&S, Clrs, IVF 3. GBS negative.   4. Fetwal well-being: reasuring 5. The patient had previously been offered the procedure with an explanation of the risks, benefits, and alternatives. These were reiterated to her today. The risks include; failure of procedure about 50% of the time (including the factors that might contribute to her procedure being less likely to succeed: nulliparity, low-normal fluid, spine anterior positioning of the fetus).  Additional risks include; pain, provocation of labor with the procedure, rupture of membranes, placental abruption (separationg of the placenta from the uterine wall) which would necessitate emergency c-section, fetal intolerance of the procedure.  The biggest benefit of the procedure is the potential, if successful, to avoid a cesarean delivery.  The alternative was that she could not have the procedure done at all and hope for the baby to spontaneously turn or simply have a c-section without attempted ECV.  Additionally, it was explained to her that she could terminate the procedure at any time during tis course for any reason and that the decision to proceed or continued with the procedure was entirely up to her.  She voiced understanding of this explanation and agreement to proceed.     [redacted]w[redacted]d, MD 12/04/2019 11:40 AM

## 2019-12-11 ENCOUNTER — Ambulatory Visit (INDEPENDENT_AMBULATORY_CARE_PROVIDER_SITE_OTHER): Payer: PRIVATE HEALTH INSURANCE

## 2019-12-11 ENCOUNTER — Other Ambulatory Visit: Payer: Self-pay | Admitting: Obstetrics and Gynecology

## 2019-12-11 ENCOUNTER — Other Ambulatory Visit: Payer: Self-pay

## 2019-12-11 ENCOUNTER — Ambulatory Visit (INDEPENDENT_AMBULATORY_CARE_PROVIDER_SITE_OTHER): Payer: PRIVATE HEALTH INSURANCE | Admitting: Obstetrics and Gynecology

## 2019-12-11 VITALS — BP 116/84 | Wt 150.0 lb

## 2019-12-11 DIAGNOSIS — O365930 Maternal care for other known or suspected poor fetal growth, third trimester, not applicable or unspecified: Secondary | ICD-10-CM

## 2019-12-11 DIAGNOSIS — O4103X0 Oligohydramnios, third trimester, not applicable or unspecified: Secondary | ICD-10-CM | POA: Diagnosis not present

## 2019-12-11 DIAGNOSIS — Z3A38 38 weeks gestation of pregnancy: Secondary | ICD-10-CM | POA: Diagnosis not present

## 2019-12-11 DIAGNOSIS — O365990 Maternal care for other known or suspected poor fetal growth, unspecified trimester, not applicable or unspecified: Secondary | ICD-10-CM

## 2019-12-11 DIAGNOSIS — O0993 Supervision of high risk pregnancy, unspecified, third trimester: Secondary | ICD-10-CM

## 2019-12-11 DIAGNOSIS — O329XX0 Maternal care for malpresentation of fetus, unspecified, not applicable or unspecified: Secondary | ICD-10-CM

## 2019-12-11 DIAGNOSIS — O321XX0 Maternal care for breech presentation, not applicable or unspecified: Secondary | ICD-10-CM | POA: Diagnosis not present

## 2019-12-11 NOTE — Progress Notes (Signed)
Routine Prenatal Care Visit  Subjective  Amy Sullivan is a 23 y.o. G1P0000 at [redacted]w[redacted]d being seen today for ongoing prenatal care.  She is currently monitored for the following issues for this low-risk pregnancy and has Supervision of high-risk pregnancy; Vomiting affecting pregnancy; Pregnancy affected by fetal growth restriction; Indication for care in labor and delivery, antepartum; and Malpresentation of fetus, antepartum on their problem list.  ----------------------------------------------------------------------------------- Patient reports no complaints.   Contractions: Not present. Vag. Bleeding: None.  Movement: Present. Denies leaking of fluid.  ----------------------------------------------------------------------------------- The following portions of the patient's history were reviewed and updated as appropriate: allergies, current medications, past family history, past medical history, past social history, past surgical history and problem list. Problem list updated.   Objective  Blood pressure 116/84, weight 150 lb (68 kg), last menstrual period 03/18/2019. Pregravid weight 125 lb (56.7 kg) Total Weight Gain 25 lb (11.3 kg) Urinalysis:      Fetal Status: Fetal Heart Rate (bpm): 135   Movement: Present  Presentation: Complete Breech  General:  Alert, oriented and cooperative. Patient is in no acute distress.  Skin: Skin is warm and dry. No rash noted.   Cardiovascular: Normal heart rate noted  Respiratory: Normal respiratory effort, no problems with respiration noted  Abdomen: Soft, gravid, appropriate for gestational age. Pain/Pressure: Absent     Pelvic:  Cervical exam deferred        Extremities: Normal range of motion.     ental Status: Normal mood and affect. Normal behavior. Normal judgment and thought content.   Baseline: 135 Variability: moderate Accelerations: present Decelerations: absent Tocometry: N/A The patient was monitored for 30 minutes, fetal  heart rate tracing was deemed reactive, category I tracing,  CPT [phone]   Assessment   23 y.o. G1P0000 at [redacted]w[redacted]d by  12/23/2019, by Last Menstrual Period presenting for routine prenatal visit  Plan   Pregnancy #1 Problems (from 03/18/19 to present)    Problem Noted Resolved   Malpresentation of fetus, antepartum 12/04/2019 by Conard Novak, MD No   Pregnancy affected by fetal growth restriction 11/06/2019 by Tresea Mall, CNM No   Supervision of high-risk pregnancy 05/25/2019 by Natale Milch, MD No   Overview Addendum 08/24/2019  9:39 AM by Nadara Mustard, MD      Clinic [facility] Prenatal Labs  Dating LMP=10wk [place] Blood type: A/Positive/-- (06/12 1030)   Genetic Screen NIPS:  Normal XX  Antibody:Negative (06/12 1030)  Anatomic [place] Complete 07/27/2019 Rubella: 3.85 (06/12 1030) Varicella: equivocal   GTT 28 wk:      RPR: Non Reactive (06/12 1030)   Rhogam  Not needed HBsAg: Negative (06/12 1030)   TDaP vaccine        due               HIV: Non Reactive (06/12 1030)   Flu Shot       due                         GBS:   Contraception    pill Pap:NIL 2020  CBB     no   CS/VBAC    n/a   Baby Food    breast   Support Person     mother    Fob not involved              Gestational age appropriate obstetric precautions including but not limited to vaginal bleeding, contractions, leaking of fluid and  fetal movement were reviewed in detail with the patient.    - Reactive NST - stable AFI  Return in about 1 day (around 12/12/2019) for H&P jackson.  Malachy Mood, MD, [facility], [facility] 12/11/2019, 11:04 AM

## 2019-12-11 NOTE — Progress Notes (Signed)
ROB AFI/NST 

## 2019-12-12 ENCOUNTER — Encounter: Payer: PRIVATE HEALTH INSURANCE | Admitting: Obstetrics and Gynecology

## 2019-12-12 ENCOUNTER — Encounter
Admission: RE | Admit: 2019-12-12 | Discharge: 2019-12-12 | Disposition: A | Discharge status: Home / Self Care | Payer: Medicaid Other | Source: Ambulatory Visit | Attending: Obstetrics and Gynecology | Admitting: Obstetrics and Gynecology

## 2019-12-12 ENCOUNTER — Other Ambulatory Visit: Payer: Self-pay

## 2019-12-12 ENCOUNTER — Ambulatory Visit (INDEPENDENT_AMBULATORY_CARE_PROVIDER_SITE_OTHER): Payer: PRIVATE HEALTH INSURANCE | Admitting: Obstetrics and Gynecology

## 2019-12-12 ENCOUNTER — Encounter: Payer: Self-pay | Admitting: Obstetrics and Gynecology

## 2019-12-12 VITALS — BP 118/74 | Ht 62.0 in | Wt 149.0 lb

## 2019-12-12 DIAGNOSIS — Z3A38 38 weeks gestation of pregnancy: Secondary | ICD-10-CM

## 2019-12-12 DIAGNOSIS — O365930 Maternal care for other known or suspected poor fetal growth, third trimester, not applicable or unspecified: Secondary | ICD-10-CM

## 2019-12-12 DIAGNOSIS — O329XX0 Maternal care for malpresentation of fetus, unspecified, not applicable or unspecified: Secondary | ICD-10-CM

## 2019-12-12 DIAGNOSIS — O365990 Maternal care for other known or suspected poor fetal growth, unspecified trimester, not applicable or unspecified: Secondary | ICD-10-CM

## 2019-12-12 DIAGNOSIS — O0993 Supervision of high risk pregnancy, unspecified, third trimester: Secondary | ICD-10-CM

## 2019-12-12 HISTORY — DX: Gastro-esophageal reflux disease without esophagitis: K21.9

## 2019-12-12 NOTE — Patient Instructions (Addendum)
Your COVID test is scheduled anytime between 8:00am and noon on Thursday 12/13/2019.  Drive up in front of [facility] entrance and remain in your vehicle.  Your procedure is scheduled on: Tuesday 12/18/2019.  Check in at the Registration Desk in the [place] [facility] (Main entrance- 1st floor) at noon  Remember: Instructions that are not followed completely may result in serious medical risk, up to and including death, or upon the discretion of your surgeon and anesthesiologist your surgery may need to be rescheduled.    __x__ 1. Do not eat food (including mints, candies, chewing gum) after midnight the night before your procedure. You may drink water up to 2 hours before you are scheduled to arrive at the hospital for your procedure.  Do not drink anything within 2 hours of your scheduled arrival to the hospital.    __x__ 2. No Alcohol or smoking for 24 hours before or after your procedure.   __x__ 3. Notify your doctor if there is any change in your medical condition (cold, fever, infections).   __x__ 4. On the morning of your procedure, brush your teeth with toothpaste and water.  You may rinse your mouth with mouthwash if you wish.  Do not swallow any toothpaste or mouthwash.  Please read over the following fact sheets that you were given:   [facility] Preparing for Surgery and/or MRSA Information    __x__ Use CHG Soap as directed on instruction sheet.   Do not wear jewelry, make-up, hairpins, clips or nail polish when you arrive at the hospital.  Do not wear lotions, powders, deodorant, or perfumes.   Do not shave below the face/neck 48 hours prior to to your procedure .   Do not bring valuables to the hospital.  [facility] is not responsible for any belongings or valuables.               For patients admitted to the hospital, discharge time is determined by your treatment team.  For patients discharged on the day of surgery, you will NOT be permitted to drive yourself home.  You  must have a responsible adult with you for 24 hours after surgery.  __x__ Take these medicines on the morning of surgery with a SMALL SIP OF WATER:  1. Famotidine (Pepcid)   2. Prenatal Vitamin  3. Promethazine (Phenergan) if needed  __x__ STARTING TODAY: Do not take any Anti-inflammatories such as Advil, Ibuprofen, Motrin, Aleve, Naproxen, Naprosyn, BC/Goodies powders or aspirin products. You CAN take Tylenol if needed.   __x__ STARTING TODAY: Do not take any over the counter supplements. You CAN take Vitamin D, Vitamin B, and multivitamin.

## 2019-12-12 NOTE — Progress Notes (Signed)
OB History & Physical   History of Present Illness:  Chief Complaint: preoperative visit for cesarean delivery  HPI:  Amy Sullivan is a 23 y.o. G1P0000 female at [redacted]w[redacted]d dated by LMP consistent with 10 week ultrasound.  Her pregnancy has been complicated by fetal growth restriction (AC<10th %ile), malpresentation of fetus with unsuccessful ECV attempt.    She denies contractions.   She denies leakage of fluid.   She denies vaginal bleeding.   She reports fetal movement.    Total weight gain for pregnancy: 25 lb (11.3 kg)   Obstetrical Problem List: Pregnancy #1 Problems (from 03/18/19 to present)    Problem Noted Resolved   Malpresentation of fetus, antepartum 12/04/2019 by Conard Novak, MD No   Pregnancy affected by fetal growth restriction 11/06/2019 by Tresea Mall, CNM No   Supervision of high-risk pregnancy 05/25/2019 by Natale Milch, MD No   Overview Addendum 08/24/2019  9:39 AM by Nadara Mustard, MD      [facility] Prenatal Labs  Dating LMP=10wk [place] Blood type: A/Positive/-- (06/12 1030)   Genetic Screen NIPS:  Normal XX  Antibody:Negative (06/12 1030)  Anatomic [place] Complete 07/27/2019 Rubella: 3.85 (06/12 1030) Varicella: equivocal   GTT 28 wk:      RPR: Non Reactive (06/12 1030)   Rhogam  Not needed HBsAg: Negative (06/12 1030)   TDaP vaccine        due               HIV: Non Reactive (06/12 1030)   Flu Shot       due                         GBS:   Contraception    pill Pap:NIL 2020  CBB     no   CS/VBAC    n/a   Baby Food    breast   Support Person     mother    Fob not involved              Maternal Medical History:   Past Medical History:  Diagnosis Date  . GERD (gastroesophageal reflux disease)   . 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 Pink eye     Past Surgical History:  Procedure Laterality Date  . WISDOM TOOTH EXTRACTION     Allergies:No Known Allergies  Prior to Admission medications   Medication Sig Start Date End  Date Taking? Authorizing Provider  calcium carbonate (TUMS - DOSED IN MG ELEMENTAL CALCIUM) 500 MG chewable tablet Chew 1 tablet by mouth daily as needed for indigestion or heartburn.    [provider]  famotidine (PEPCID) 20 MG tablet Take 20 mg by mouth daily.    [provider]  Prenatal Vit-Fe Fumarate-FA (PRENATAL MULTIVITAMIN) TABS tablet Take 1 tablet by mouth daily at 12 noon.    [provider]  promethazine (PHENERGAN) 25 MG tablet Take 1 tablet (25 mg total) by mouth every 6 (six) hours as needed for nausea or vomiting. 10/05/19   Oswaldo Conroy, CNM    OB History  Gravida Para Term Preterm AB Living  1 0 0 0 0 0  SAB TAB Ectopic Multiple Live Births  0 0 0 0 0    # Outcome Date GA Lbr Len/2nd Weight Sex Delivery Anes PTL Lv  1 Current             Prenatal care site: [place]  [facility]  Social History: She  reports that she has quit smoking. Her smoking use included cigarettes. She smoked 0.00 packs per day. She has never used smokeless tobacco. She reports current alcohol use. She reports current drug use. Drug: Marijuana.  Family History: family history includes Cancer (age of onset: 82) in her maternal grandmother.   Review of Systems:  Review of Systems  Constitutional: Negative.   HENT: Negative.   Eyes: Negative.   Respiratory: Negative.   Cardiovascular: Negative.   Gastrointestinal: Negative.   Genitourinary: Negative.   Musculoskeletal: Negative.   Skin: Negative.   Neurological: Negative.   Psychiatric/Behavioral: Negative.      Physical Exam:  BP 118/74   Ht 5\' 2"  (1.575 m)   Wt 149 lb (67.6 kg)   LMP 03/18/2019 (Exact Date)   BMI 27.25 kg/m   Physical Exam Constitutional:      General: She is not in acute distress.    Appearance: Normal appearance. She is well-developed.  HENT:     Head: Normocephalic and atraumatic.  Eyes:     General: No scleral icterus.    Conjunctiva/sclera: Conjunctivae normal.   Cardiovascular:     Rate and Rhythm: Normal rate and regular rhythm.     Heart sounds: No murmur. No friction rub. No gallop.   Pulmonary:     Effort: Pulmonary effort is normal. No respiratory distress.     Breath sounds: Normal breath sounds. No wheezing or rales.  Abdominal:     General: Bowel sounds are normal. There is no distension.     Palpations: Abdomen is soft. There is no mass.     Tenderness: There is no abdominal tenderness. There is no guarding or rebound.  Musculoskeletal:        General: Normal range of motion.     Cervical back: Normal range of motion and neck supple.  Neurological:     General: No focal deficit present.     Mental Status: She is alert and oriented to person, place, and time.     Cranial Nerves: No cranial nerve deficit.  Skin:    General: Skin is warm and dry.     Findings: No erythema.  Psychiatric:        Mood and Affect: Mood normal.        Behavior: Behavior normal.        Judgment: Judgment normal.     Baseline FHR: 115 beats/min     U/S yesterday confirmed complete breech presentation.   No results found for: SARSCOV2NAA]  Assessment:  [name] is a 23 y.o. G1P0000 female at [redacted]w[redacted]d with fetal growth restriction and malpresentation of fetus (complete breech).   Plan:  1. Admit to Labor & Delivery  2. CBC, T&S, NPO, IVF 3. GBS negative.   4. To OR for primary c-section for breech on 1/5.  Will check positioning of baby day of surgery prior to incision.  Consents signed today.   Prentice Docker, MD 12/12/2019 3:46 PM

## 2019-12-13 ENCOUNTER — Other Ambulatory Visit
Admission: RE | Admit: 2019-12-13 | Discharge: 2019-12-13 | Disposition: A | Discharge status: Home / Self Care | Payer: PRIVATE HEALTH INSURANCE | Source: Ambulatory Visit | Attending: Obstetrics and Gynecology | Admitting: Obstetrics and Gynecology

## 2019-12-13 DIAGNOSIS — Z20828 Contact with and (suspected) exposure to other viral communicable diseases: Secondary | ICD-10-CM | POA: Insufficient documentation

## 2019-12-13 DIAGNOSIS — Z01812 Encounter for preprocedural laboratory examination: Secondary | ICD-10-CM | POA: Diagnosis present

## 2019-12-13 LAB — CBC
HCT: 33.2 % — ABNORMAL LOW (ref 36.0–46.0)
Hemoglobin: 11.2 g/dL — ABNORMAL LOW (ref 12.0–15.0)
MCH: 30 pg (ref 26.0–34.0)
MCHC: 33.7 g/dL (ref 30.0–36.0)
MCV: 89 fL (ref 80.0–100.0)
Platelets: 209 10*3/uL (ref 150–400)
RBC: 3.73 MIL/uL — ABNORMAL LOW (ref 3.87–5.11)
RDW: 12.2 % (ref 11.5–15.5)
WBC: 7.7 10*3/uL (ref 4.0–10.5)
nRBC: 0 % (ref 0.0–0.2)

## 2019-12-13 LAB — TYPE AND SCREEN
ABO/RH(D): A POS
Antibody Screen: NEGATIVE
Extend sample reason: UNDETERMINED

## 2019-12-13 LAB — RAPID HIV SCREEN (HIV 1/2 AB+AG)
HIV 1/2 Antibodies: NONREACTIVE
HIV-1 P24 Antigen - HIV24: NONREACTIVE

## 2019-12-14 LAB — SARS CORONAVIRUS 2 (TAT 6-24 HRS): SARS Coronavirus 2: NEGATIVE

## 2019-12-14 LAB — RPR: RPR Ser Ql: NONREACTIVE

## 2019-12-16 ENCOUNTER — Inpatient Hospital Stay
Admission: EM | Admit: 2019-12-16 | Discharge: 2019-12-19 | DRG: 787 | Disposition: A | Discharge status: Home / Self Care | Payer: Medicaid Other | Attending: Obstetrics and Gynecology | Admitting: Obstetrics and Gynecology

## 2019-12-16 ENCOUNTER — Encounter
Admission: EM | Disposition: A | Discharge status: Home / Self Care | Payer: Self-pay | Source: Home / Self Care | Attending: Obstetrics and Gynecology

## 2019-12-16 ENCOUNTER — Inpatient Hospital Stay: Payer: Medicaid Other | Admitting: Anesthesiology

## 2019-12-16 ENCOUNTER — Encounter: Payer: Self-pay | Admitting: Obstetrics and Gynecology

## 2019-12-16 ENCOUNTER — Other Ambulatory Visit: Payer: Self-pay

## 2019-12-16 DIAGNOSIS — D62 Acute posthemorrhagic anemia: Secondary | ICD-10-CM | POA: Diagnosis not present

## 2019-12-16 DIAGNOSIS — Z3A39 39 weeks gestation of pregnancy: Secondary | ICD-10-CM | POA: Diagnosis not present

## 2019-12-16 DIAGNOSIS — O321XX0 Maternal care for breech presentation, not applicable or unspecified: Principal | ICD-10-CM | POA: Diagnosis present

## 2019-12-16 DIAGNOSIS — O365930 Maternal care for other known or suspected poor fetal growth, third trimester, not applicable or unspecified: Secondary | ICD-10-CM

## 2019-12-16 DIAGNOSIS — O329XX0 Maternal care for malpresentation of fetus, unspecified, not applicable or unspecified: Secondary | ICD-10-CM | POA: Diagnosis present

## 2019-12-16 DIAGNOSIS — Z87891 Personal history of nicotine dependence: Secondary | ICD-10-CM | POA: Diagnosis not present

## 2019-12-16 DIAGNOSIS — O9081 Anemia of the puerperium: Secondary | ICD-10-CM | POA: Diagnosis not present

## 2019-12-16 DIAGNOSIS — O0993 Supervision of high risk pregnancy, unspecified, third trimester: Secondary | ICD-10-CM

## 2019-12-16 DIAGNOSIS — O365990 Maternal care for other known or suspected poor fetal growth, unspecified trimester, not applicable or unspecified: Secondary | ICD-10-CM

## 2019-12-16 LAB — RUPTURE OF MEMBRANE (ROM)PLUS: Rom Plus: POSITIVE

## 2019-12-16 SURGERY — Surgical Case
Anesthesia: Spinal | Site: Abdomen

## 2019-12-16 MED ORDER — NALBUPHINE HCL 10 MG/ML IJ SOLN: 5.0000 mg | Freq: Once | INTRAMUSCULAR | Status: DC | PRN

## 2019-12-16 MED ORDER — PHENYLEPHRINE HCL (PRESSORS) 10 MG/ML IV SOLN
INTRAVENOUS | Status: AC
  Filled 2019-12-16: qty 1

## 2019-12-16 MED ORDER — SOD CITRATE-CITRIC ACID 500-334 MG/5ML PO SOLN
30.0000 mL | ORAL | Status: AC
  Administered 2019-12-16: 30 mL via ORAL
  Filled 2019-12-16: qty 30

## 2019-12-16 MED ORDER — LACTATED RINGERS IV SOLN: Freq: Once | INTRAVENOUS | Status: AC

## 2019-12-16 MED ORDER — COCONUT OIL OIL
1.0000 "application " | TOPICAL_OIL | Status: DC | PRN
  Filled 2019-12-16: qty 120

## 2019-12-16 MED ORDER — WITCH HAZEL-GLYCERIN EX PADS: 1.0000 "application " | MEDICATED_PAD | CUTANEOUS | Status: DC | PRN

## 2019-12-16 MED ORDER — SIMETHICONE 80 MG PO CHEW
80.0000 mg | CHEWABLE_TABLET | ORAL | Status: DC
  Administered 2019-12-16 – 2019-12-19 (×3): 80 mg via ORAL
  Filled 2019-12-16 (×3): qty 1

## 2019-12-16 MED ORDER — ONDANSETRON HCL 4 MG/2ML IJ SOLN
INTRAMUSCULAR | Status: DC | PRN
  Administered 2019-12-16: 4 mg via INTRAVENOUS

## 2019-12-16 MED ORDER — BUPIVACAINE HCL (PF) 0.5 % IJ SOLN
INTRAMUSCULAR | Status: DC | PRN
  Administered 2019-12-16: 30 mL

## 2019-12-16 MED ORDER — BUPIVACAINE 0.25 % ON-Q PUMP DUAL CATH 400 ML
400.0000 mL | INJECTION | Status: DC
  Filled 2019-12-16: qty 400

## 2019-12-16 MED ORDER — KETOROLAC TROMETHAMINE 30 MG/ML IJ SOLN: 30.0000 mg | Freq: Four times a day (QID) | INTRAMUSCULAR | Status: AC | PRN

## 2019-12-16 MED ORDER — ONDANSETRON HCL 4 MG/2ML IJ SOLN: 4.0000 mg | Freq: Once | INTRAMUSCULAR | Status: DC | PRN

## 2019-12-16 MED ORDER — FENTANYL CITRATE (PF) 100 MCG/2ML IJ SOLN
INTRAMUSCULAR | Status: AC
  Filled 2019-12-16: qty 2

## 2019-12-16 MED ORDER — FENTANYL CITRATE (PF) 100 MCG/2ML IJ SOLN
INTRAMUSCULAR | Status: DC | PRN
  Administered 2019-12-16: 15 ug via INTRAVENOUS

## 2019-12-16 MED ORDER — OXYTOCIN 40 UNITS IN NORMAL SALINE INFUSION - SIMPLE MED
INTRAVENOUS | Status: DC | PRN
  Administered 2019-12-16: 1000 mL via INTRAVENOUS

## 2019-12-16 MED ORDER — LACTATED RINGERS IV SOLN: INTRAVENOUS | Status: DC

## 2019-12-16 MED ORDER — ACETAMINOPHEN 500 MG PO TABS
1000.0000 mg | ORAL_TABLET | Freq: Four times a day (QID) | ORAL | Status: AC
  Administered 2019-12-16 – 2019-12-17 (×4): 1000 mg via ORAL
  Filled 2019-12-16 (×4): qty 2

## 2019-12-16 MED ORDER — DIPHENHYDRAMINE HCL 25 MG PO CAPS: 25.0000 mg | ORAL_CAPSULE | Freq: Four times a day (QID) | ORAL | Status: DC | PRN

## 2019-12-16 MED ORDER — SIMETHICONE 80 MG PO CHEW
80.0000 mg | CHEWABLE_TABLET | Freq: Three times a day (TID) | ORAL | Status: DC
  Administered 2019-12-16 – 2019-12-19 (×11): 80 mg via ORAL
  Filled 2019-12-16 (×11): qty 1

## 2019-12-16 MED ORDER — BUPIVACAINE HCL (PF) 0.5 % IJ SOLN: 5.0000 mL | Freq: Once | INTRAMUSCULAR | Status: DC

## 2019-12-16 MED ORDER — ACETAMINOPHEN 325 MG PO TABS
650.0000 mg | ORAL_TABLET | ORAL | Status: DC | PRN
  Administered 2019-12-19: 650 mg via ORAL
  Filled 2019-12-16: qty 2

## 2019-12-16 MED ORDER — FENTANYL CITRATE (PF) 100 MCG/2ML IJ SOLN: 25.0000 ug | INTRAMUSCULAR | Status: DC | PRN

## 2019-12-16 MED ORDER — BUPIVACAINE HCL (PF) 0.5 % IJ SOLN
5.0000 mL | Freq: Once | INTRAMUSCULAR | Status: DC
  Filled 2019-12-16: qty 30

## 2019-12-16 MED ORDER — ZOLPIDEM TARTRATE 5 MG PO TABS: 5.0000 mg | ORAL_TABLET | Freq: Every evening | ORAL | Status: DC | PRN

## 2019-12-16 MED ORDER — OXYTOCIN 40 UNITS IN NORMAL SALINE INFUSION - SIMPLE MED
2.5000 [IU]/h | INTRAVENOUS | Status: AC
  Filled 2019-12-16: qty 1000

## 2019-12-16 MED ORDER — MENTHOL 3 MG MT LOZG
1.0000 | LOZENGE | OROMUCOSAL | Status: DC | PRN
  Filled 2019-12-16: qty 9

## 2019-12-16 MED ORDER — MORPHINE SULFATE (PF) 2 MG/ML IV SOLN: 1.0000 mg | INTRAVENOUS | Status: DC | PRN

## 2019-12-16 MED ORDER — NALBUPHINE HCL 10 MG/ML IJ SOLN: 5.0000 mg | INTRAMUSCULAR | Status: DC | PRN

## 2019-12-16 MED ORDER — MORPHINE SULFATE (PF) 0.5 MG/ML IJ SOLN
INTRAMUSCULAR | Status: DC | PRN
  Administered 2019-12-16: .2 mg via EPIDURAL

## 2019-12-16 MED ORDER — SODIUM CHLORIDE 0.9% FLUSH: 3.0000 mL | INTRAVENOUS | Status: DC | PRN

## 2019-12-16 MED ORDER — NALOXONE HCL 0.4 MG/ML IJ SOLN: 0.4000 mg | INTRAMUSCULAR | Status: DC | PRN

## 2019-12-16 MED ORDER — SODIUM CHLORIDE 0.9 % IV SOLN
INTRAVENOUS | Status: DC | PRN
  Administered 2019-12-16: 25 ug/min via INTRAVENOUS

## 2019-12-16 MED ORDER — BUPIVACAINE IN DEXTROSE 0.75-8.25 % IT SOLN
INTRATHECAL | Status: DC | PRN
  Administered 2019-12-16: 1.8 mL via INTRATHECAL

## 2019-12-16 MED ORDER — CEFAZOLIN SODIUM-DEXTROSE 2-4 GM/100ML-% IV SOLN
2.0000 g | INTRAVENOUS | Status: DC
  Filled 2019-12-16: qty 100

## 2019-12-16 MED ORDER — IBUPROFEN 800 MG PO TABS
800.0000 mg | ORAL_TABLET | Freq: Four times a day (QID) | ORAL | Status: DC
  Administered 2019-12-17 – 2019-12-19 (×10): 800 mg via ORAL
  Filled 2019-12-16 (×10): qty 1

## 2019-12-16 MED ORDER — NALOXONE HCL 4 MG/10ML IJ SOLN
1.0000 ug/kg/h | INTRAVENOUS | Status: DC | PRN
  Filled 2019-12-16: qty 5

## 2019-12-16 MED ORDER — DIPHENHYDRAMINE HCL 50 MG/ML IJ SOLN: 12.5000 mg | INTRAMUSCULAR | Status: DC | PRN

## 2019-12-16 MED ORDER — KETOROLAC TROMETHAMINE 30 MG/ML IJ SOLN: 30.0000 mg | Freq: Once | INTRAMUSCULAR | Status: AC

## 2019-12-16 MED ORDER — MORPHINE SULFATE (PF) 0.5 MG/ML IJ SOLN
INTRAMUSCULAR | Status: AC
  Filled 2019-12-16: qty 10

## 2019-12-16 MED ORDER — ONDANSETRON HCL 4 MG/2ML IJ SOLN
INTRAMUSCULAR | Status: AC
  Filled 2019-12-16: qty 2

## 2019-12-16 MED ORDER — OXYTOCIN 40 UNITS IN NORMAL SALINE INFUSION - SIMPLE MED
INTRAVENOUS | Status: AC
  Filled 2019-12-16: qty 1000

## 2019-12-16 MED ORDER — DIBUCAINE (PERIANAL) 1 % EX OINT: 1.0000 "application " | TOPICAL_OINTMENT | CUTANEOUS | Status: DC | PRN

## 2019-12-16 MED ORDER — KETOROLAC TROMETHAMINE 30 MG/ML IJ SOLN
30.0000 mg | Freq: Four times a day (QID) | INTRAMUSCULAR | Status: AC
  Administered 2019-12-16 – 2019-12-17 (×4): 30 mg via INTRAVENOUS
  Filled 2019-12-16 (×4): qty 1

## 2019-12-16 MED ORDER — DIPHENHYDRAMINE HCL 25 MG PO CAPS: 25.0000 mg | ORAL_CAPSULE | ORAL | Status: DC | PRN

## 2019-12-16 MED ORDER — PRENATAL MULTIVITAMIN CH
1.0000 | ORAL_TABLET | Freq: Every day | ORAL | Status: DC
  Administered 2019-12-16 – 2019-12-19 (×4): 1 via ORAL
  Filled 2019-12-16 (×4): qty 1

## 2019-12-16 MED ORDER — VARICELLA VIRUS VACCINE LIVE 1350 PFU/0.5ML IJ SUSR
0.5000 mL | Freq: Once | INTRAMUSCULAR | Status: DC
  Filled 2019-12-16: qty 0.5

## 2019-12-16 MED ORDER — SIMETHICONE 80 MG PO CHEW: 80.0000 mg | CHEWABLE_TABLET | ORAL | Status: DC | PRN

## 2019-12-16 MED ORDER — MEPERIDINE HCL 25 MG/ML IJ SOLN: 6.2500 mg | INTRAMUSCULAR | Status: DC | PRN

## 2019-12-16 MED ORDER — SENNOSIDES-DOCUSATE SODIUM 8.6-50 MG PO TABS
2.0000 | ORAL_TABLET | ORAL | Status: DC
  Administered 2019-12-16 – 2019-12-19 (×3): 2 via ORAL
  Filled 2019-12-16 (×3): qty 2

## 2019-12-16 MED ORDER — BUPIVACAINE ON-Q PAIN PUMP (FOR ORDER SET NO CHG)
INJECTION | Status: AC
  Filled 2019-12-16: qty 1

## 2019-12-16 MED ORDER — OXYCODONE-ACETAMINOPHEN 5-325 MG PO TABS: 1.0000 | ORAL_TABLET | ORAL | Status: DC | PRN

## 2019-12-16 SURGICAL SUPPLY — 32 items
ADH SKN CLS APL DERMABOND .7 (GAUZE/BANDAGES/DRESSINGS) ×1
APL PRP STRL LF DISP 70% ISPRP (MISCELLANEOUS) ×2
BAG COUNTER SPONGE EZ (MISCELLANEOUS) ×2 IMPLANT
BAG SPNG 4X4 CLR HAZ (MISCELLANEOUS) ×1
CANISTER SUCT 3000ML PPV (MISCELLANEOUS) ×2 IMPLANT
CATH KIT ON-Q SILVERSOAK 5 (CATHETERS) ×2 IMPLANT
CATH KIT ON-Q SILVERSOAK 5IN (CATHETERS) ×4 IMPLANT
CHLORAPREP W/TINT 26 (MISCELLANEOUS) ×4 IMPLANT
DERMABOND ADVANCED (GAUZE/BANDAGES/DRESSINGS) ×1
DERMABOND ADVANCED .7 DNX12 (GAUZE/BANDAGES/DRESSINGS) ×1 IMPLANT
DRSG OPSITE POSTOP 4X10 (GAUZE/BANDAGES/DRESSINGS) ×2 IMPLANT
DRSG TELFA 3X8 NADH (GAUZE/BANDAGES/DRESSINGS) ×2 IMPLANT
ELECT CAUTERY BLADE 6.4 (BLADE) ×2 IMPLANT
ELECT REM PT RETURN 9FT ADLT (ELECTROSURGICAL) ×2
ELECTRODE REM PT RTRN 9FT ADLT (ELECTROSURGICAL) ×1 IMPLANT
GAUZE SPONGE 4X4 12PLY STRL (GAUZE/BANDAGES/DRESSINGS) ×2 IMPLANT
GLOVE BIO SURGEON STRL SZ7 (GLOVE) ×2 IMPLANT
GLOVE INDICATOR 7.5 STRL GRN (GLOVE) ×2 IMPLANT
GOWN STRL REUS W/ TWL LRG LVL3 (GOWN DISPOSABLE) ×3 IMPLANT
GOWN STRL REUS W/TWL LRG LVL3 (GOWN DISPOSABLE) ×6
NS IRRIG 1000ML POUR BTL (IV SOLUTION) ×2 IMPLANT
PACK C SECTION AR (MISCELLANEOUS) ×2 IMPLANT
PAD DRESSING TELFA 3X8 NADH (GAUZE/BANDAGES/DRESSINGS) ×1 IMPLANT
PAD OB MATERNITY 4.3X12.25 (PERSONAL CARE ITEMS) ×2 IMPLANT
PAD PREP 24X41 OB/GYN DISP (PERSONAL CARE ITEMS) ×2 IMPLANT
PENCIL SMOKE ULTRAEVAC 22 CON (MISCELLANEOUS) ×2 IMPLANT
STRIP CLOSURE SKIN 1/2X4 (GAUZE/BANDAGES/DRESSINGS) ×2 IMPLANT
SUT MNCRL AB 4-0 PS2 18 (SUTURE) ×2 IMPLANT
SUT PDS AB 1 TP1 96 (SUTURE) ×4 IMPLANT
SUT VIC AB 0 CTX 36 (SUTURE) ×4
SUT VIC AB 0 CTX36XBRD ANBCTRL (SUTURE) ×2 IMPLANT
SUT VIC AB 2-0 CT1 36 (SUTURE) ×2 IMPLANT

## 2019-12-16 NOTE — Op Note (Signed)
Cesarean Section Procedure Note Indications: malpresentation: Breech and term intrauterine pregnancy Pre-operative Diagnosis: malpresentation: Breech and term intrauterine pregnancy    ([redacted]w[redacted]d) Post-operative Diagnosis: same, delivered. Procedure: Low Transverse Cesarean Section Surgeon: Annamarie Major, MD, FACOG Assistant(s): tech Anesthesia: Spinal anesthesia Estimated Blood Loss:300  Complications: None; patient tolerated the procedure well. Disposition: PACU - hemodynamically stable. Condition: stable  Findings: A female infant in the breech (complete) presentation. "[place]" Amniotic fluid - Clear  Birth weight 6-2 lbs.  Apgars of 9 and 9.  Intact placenta with a three-vessel cord. Grossly normal uterus, tubes and ovaries bilaterally.  intraabdominal adhesions were noted.  Procedure Details   The patient was taken to Operating Room, identified as the correct patient and the procedure verified as C-Section Delivery. A Time Out was held and the above information confirmed. After induction of anesthesia, the patient was draped and prepped in the usual sterile manner. A Pfannenstiel incision was made and carried down through the subcutaneous tissue to the fascia. Fascial incision was made and extended transversely with the Mayo scissors. The fascia was separated from the underlying rectus tissue superiorly and inferiorly. The peritoneum was identified and entered bluntly. Peritoneal incision was extended longitudinally. The utero-vesical peritoneal reflection was incised transversely and a bladder flap was created digitally.  A low transverse hysterotomy was made. The fetus was delivered atraumatically from the breech presentation. The umbilical cord was clamped x2 and cut and the infant was handed to the awaiting pediatricians. The placenta was removed intact and appeared normal with a 3-vessel cord.  The uterus was exteriorized and cleared of all clot and debris. The hysterotomy was closed  with running sutures of 0 Vicryl suture. A second imbricating layer was placed with the same suture. Excellent hemostasis was observed. The uterus was returned to the abdomen. The pelvis was irrigated and again, excellent hemostasis was noted.  The On Q Pain pump System was then placed.  Trocars were placed through the abdominal wall into the subfascial space and these were used to thread the silver soaker cathaters into place.The rectus fascia was then reapproximated with running sutures of Maxon, with careful placement not to incorporate the cathaters. Subcutaneous tissues are then irrigated with saline and hemostasis assured.  Skin is then closed with 4-0 vicryl suture in a subcuticular fashion followed by skin adhesive. The cathaters are flushed each with 5 mL of Bupivicaine and stabilized into place with dressing. Instrument, sponge, and needle counts were correct prior to the abdominal closure and at the conclusion of the case.  The patient tolerated the procedure well and was transferred to the recovery room in stable condition.   Annamarie Major, MD, [facility], [facility] 12/16/2019  9:40 AM

## 2019-12-16 NOTE — Anesthesia Procedure Notes (Signed)
Spinal  Patient location during procedure: OR End time: 12/16/2019 8:38 AM Staffing Performed: anesthesiologist  Anesthesiologist: Gunnar Fusi, MD Resident/CRNA: Jonna Clark, CRNA Preanesthetic Checklist Completed: patient identified, IV checked, site marked, risks and benefits discussed, surgical consent, monitors and equipment checked, pre-op evaluation and timeout performed Spinal Block Patient position: sitting Prep: Betadine Patient monitoring: heart rate, continuous pulse ox, blood pressure and cardiac monitor Approach: midline Location: L4-5 Injection technique: single-shot Needle Needle type: Whitacre and Introducer  Needle gauge: 24 G Needle length: 9 cm Additional Notes Negative paresthesia. Negative blood return. Positive free-flowing CSF. Expiration date of kit checked and confirmed. Patient tolerated procedure well, without complications.

## 2019-12-16 NOTE — Discharge Summary (Signed)
OB Discharge Summary     Patient Name: Amy Sullivan DOB: 1996/03/18 MRN: [id number]  Date of admission: 12/16/2019 Delivering MD: Hoyt Koch, MD  Date of Delivery: 12/16/2019  Date of discharge: 12/19/2019  Admitting diagnosis: Malpresentation before onset of labor, single or unspecified fetus [O32.9XX0] Intrauterine pregnancy: [redacted]w[redacted]d     Secondary diagnosis: None     Discharge diagnosis: Term Pregnancy Delivered, Reasons for cesarean section  Malpresentation breech, lactating mom                      Hospital course:  Onset of Labor With Unplanned C/S  24 y.o. yo G1P0000 at [redacted]w[redacted]d was admitted in Latent Labor on 12/16/2019. Patient had a labor course significant for none as she was still breech. Spontaneous at home Membrane Rupture Time/Date: 1:00 AM ,12/16/2019   The patient went for cesarean section due to Malpresentation, and delivered a Viable infant,12/16/2019. Details of operation can be found in separate operative note. Patient had an uncomplicated postpartum course.  She is tolerating a regular diet. Her pain is controlled with PO medication and On Q pump. She is ambulating and voiding without difficulty. She reports breastfeeding is going well. Her milk is coming in.  Patient is discharged home in stable condition 12/19/19.                                                                 Post partum procedures:none  Complications: None  Physical exam on 12/19/2019: Vitals:   12/18/19 1000 12/18/19 1612 12/19/19 0030 12/19/19 0809  BP: 119/88 118/75 121/83 115/83  Pulse: 81 90 84 77  Resp: 18 20 19 18   Temp: 98 F (36.7 C) 98 F (36.7 C) 98 F (36.7 C) 98.3 F (36.8 C)  TempSrc: Oral Oral Oral Oral  SpO2: 100%  100% 99%  Weight:      Height:       General: alert, cooperative and no distress Lochia: appropriate Uterine Fundus: firm Incision: Healing well with no significant drainage, Dressing is clean, dry, and intact DVT Evaluation: No evidence of DVT seen on  physical exam.  Labs: Lab Results  Component Value Date   WBC 10.7 (H) 12/17/2019   HGB 8.8 (L) 12/17/2019   HCT 25.3 (L) 12/17/2019   MCV 84.9 12/17/2019   PLT 177 12/17/2019   No flowsheet data found.  Discharge instruction: per After Visit Summary.  Medications:  Allergies as of 12/19/2019   No Known Allergies     Medication List    STOP taking these medications   calcium carbonate 500 MG chewable tablet Commonly known as: TUMS - dosed in mg elemental calcium   famotidine 20 MG tablet Commonly known as: PEPCID   promethazine 25 MG tablet Commonly known as: PHENERGAN     TAKE these medications   oxyCODONE 5 MG immediate release tablet Commonly known as: Roxicodone Take 1 tablet (5 mg total) by mouth every 6 (six) hours as needed for up to 5 days for severe pain.   prenatal multivitamin Tabs tablet Take 1 tablet by mouth daily at 12 noon.            Discharge Care Instructions  (From admission, onward)         Start  Ordered   12/19/19 0000  Discharge wound care:    Comments: Keep incision dry, clean.   12/19/19 1150          Diet: routine diet  Activity: Advance as tolerated. Pelvic rest for 6 weeks.   Outpatient follow up: Follow-up Information    Nadara Mustard, MD. Go on 12/25/2019.   Specialty: Obstetrics and Gynecology Why: Postpartum follow up appointment on Tuesday January 12th at 4:20 PM with Dr. Tiburcio Pea for an incision check Contact information: [address] [place] [address] [phone]             Postpartum contraception: Progesterone only pills Rhogam Given postpartum: no Rubella vaccine given postpartum: no Varicella vaccine given postpartum: yes TDaP given antepartum or postpartum: Yes  Newborn Data: Live born female [place] Birth Weight: 6 lb 1.7 oz (2770 g)  APGAR: 9, 9  Newborn Delivery   Birth date/time: 12/16/2019 08:53:00 Delivery type: C-Section, Low Transverse Trial of labor: No C-section  categorization: Primary       Baby Feeding: Breast  Disposition:home with mother  SIGNED: Tresea Mall, CNM 12/19/2019 11:50 AM

## 2019-12-16 NOTE — OB Triage Note (Signed)
Pt presents to unit c/o of LOF that began around 0100 this am and ctx that are at least 5 minutes apart, rating pain at 7/10. Pt states she is scheduled for a c/s on 1/05 due to fetal breech presentation. Pt reports +FM, denies vaginal bleeding. Initial FHTs 115s. Vital signs WDL.

## 2019-12-16 NOTE — Transfer of Care (Signed)
Immediate Anesthesia Transfer of Care Note  Patient: Amy Sullivan  Procedure(s) Performed: CESAREAN SECTION (N/A Abdomen)  Patient Location: Mother/Baby  Anesthesia Type:Spinal  Level of Consciousness: awake, alert  and oriented  Airway & Oxygen Therapy: Patient Spontanous Breathing  Post-op Assessment: Report given to RN and Post -op Vital signs reviewed and stable  Post vital signs: Reviewed and stable  Last Vitals:  Vitals Value Taken Time  BP 100/60 12/16/19 0939  Temp 36.2 C 12/16/19 0939  Pulse 63 12/16/19 0939  Resp 20 12/16/19 0939  SpO2 100 % 12/16/19 0939  Vitals shown include unvalidated device data.  Last Pain:  Vitals:   12/16/19 0939  TempSrc: Temporal  PainSc:          Complications: No apparent anesthesia complications

## 2019-12-16 NOTE — Anesthesia Preprocedure Evaluation (Signed)
Anesthesia Evaluation  Patient identified by MRN, date of birth, ID band Patient awake    Reviewed: Allergy & Precautions, NPO status , Patient's Chart, lab work & pertinent test results  History of Anesthesia Complications Negative for: history of anesthetic complications  Airway Mallampati: II       Dental   Pulmonary neg sleep apnea, neg COPD, former smoker,           Cardiovascular (-) hypertension(-) Past MI and (-) CHF (-) dysrhythmias (-) Valvular Problems/Murmurs     Neuro/Psych neg Seizures    GI/Hepatic Neg liver ROS, GERD (with pregnancy)  Medicated and Poorly Controlled,  Endo/Other  neg diabetes  Renal/GU negative Renal ROS     Musculoskeletal   Abdominal   Peds  Hematology   Anesthesia Other Findings   Reproductive/Obstetrics                             Anesthesia Physical Anesthesia Plan  ASA: II and emergent  Anesthesia Plan: Spinal   Post-op Pain Management:    Induction:   PONV Risk Score and Plan:   Airway Management Planned:   Additional Equipment:   Intra-op Plan:   Post-operative Plan:   Informed Consent: I have reviewed the patients History and Physical, chart, labs and discussed the procedure including the risks, benefits and alternatives for the proposed anesthesia with the patient or authorized representative who has indicated his/her understanding and acceptance.       Plan Discussed with:   Anesthesia Plan Comments:         Anesthesia Quick Evaluation

## 2019-12-16 NOTE — H&P (Signed)
History and Physical Interval Note:  12/16/2019 8:11 AM  Amy Sullivan  has presented today for surgery, with the diagnosis of breech  The various methods of treatment have been discussed with the patient and family. After consideration of risks, benefits and other options for treatment, the patient has consented to  Procedure(s): CESAREAN SECTION (N/A) as a surgical intervention .  The patient's history has been reviewed, patient examined, no change in status, stable for surgery.  Questions were answered to the patient's satisfaction.    The risks of cesarean section discussed with the patient included but were not limited to: bleeding which may require transfusion or reoperation; infection which may require antibiotics; injury to bowel, bladder, ureters or other surrounding organs; injury to the fetus; need for additional procedures including hysterectomy in the event of a life-threatening hemorrhage; placental abnormalities wth subsequent pregnancies, incisional problems, thromboembolic phenomenon and other postoperative/anesthesia complications. The patient concurred with the proposed plan, giving informed written consent for the procedure.    Annamarie Major, MD, [facility], [facility] 12/16/2019  8:11 AM

## 2019-12-16 NOTE — H&P (Signed)
Date of Initial H&P: 12/12/2019  History reviewed, 24 year old G1P0000 at [redacted]w[redacted]d presenting with SROM.  Patient examined, no change in status with bedside ultrasound still confirming breech presentation, stable for surgery.

## 2019-12-17 LAB — CBC
HCT: 25.3 % — ABNORMAL LOW (ref 36.0–46.0)
Hemoglobin: 8.8 g/dL — ABNORMAL LOW (ref 12.0–15.0)
MCH: 29.5 pg (ref 26.0–34.0)
MCHC: 34.8 g/dL (ref 30.0–36.0)
MCV: 84.9 fL (ref 80.0–100.0)
Platelets: 177 10*3/uL (ref 150–400)
RBC: 2.98 MIL/uL — ABNORMAL LOW (ref 3.87–5.11)
RDW: 12.1 % (ref 11.5–15.5)
WBC: 10.7 10*3/uL — ABNORMAL HIGH (ref 4.0–10.5)
nRBC: 0 % (ref 0.0–0.2)

## 2019-12-17 MED ORDER — FERROUS SULFATE 325 (65 FE) MG PO TABS
325.0000 mg | ORAL_TABLET | Freq: Two times a day (BID) | ORAL | Status: DC
  Administered 2019-12-17 – 2019-12-19 (×6): 325 mg via ORAL
  Filled 2019-12-17 (×6): qty 1

## 2019-12-17 MED ORDER — ASCORBIC ACID 500 MG PO TABS
500.0000 mg | ORAL_TABLET | Freq: Two times a day (BID) | ORAL | Status: DC
  Administered 2019-12-17 – 2019-12-19 (×6): 500 mg via ORAL
  Filled 2019-12-17 (×6): qty 1

## 2019-12-17 MED ORDER — VARICELLA VIRUS VACCINE LIVE 1350 PFU/0.5ML IJ SUSR
0.5000 mL | Freq: Once | INTRAMUSCULAR | Status: DC
  Filled 2019-12-17: qty 0.5

## 2019-12-17 NOTE — Progress Notes (Signed)
POD #1 Primary low transverse Cesarean section Subjective:   Very tired, has not slept in 2 days. Working with breast feeding. Tolerating regular diet and passing flatus. Voiding without difficulty. Denies being lightheaded  Objective:  Blood pressure 110/75, pulse 67, temperature 98.7 F (37.1 C), temperature source Oral, resp. rate 18, height 5\' 3"  (1.6 m), weight 68 kg, last menstrual period 03/18/2019, SpO2 98 %, unknown if currently breastfeeding.  General: NAD Pulmonary: no increased work of breathing, CTAB Heart: RRR without murmur Abdomen: non-distended, non-tender, fundus firm at level of umbilicus-2FB Incision: C+D+I honeycomb dressing; ON Q intact Extremities: no edema, no erythema, no tenderness  Results for orders placed or performed during the hospital encounter of 12/16/19 (from the past 72 hour(s))  ROM Plus ([facility] only)     Status: None   Collection Time: 12/16/19  6:48 AM  Result Value Ref Range   Rom Plus POSITIVE     Comment: Performed at [facility], [address] [place], [place]  CBC     Status: Abnormal   Collection Time: 12/17/19  5:17 AM  Result Value Ref Range   WBC 10.7 (H) 4.0 - 10.5 K/uL   RBC 2.98 (L) 3.87 - 5.11 MIL/uL   Hemoglobin 8.8 (L) 12.0 - 15.0 g/dL   HCT 02/14/20 (L) 79.0 - 24.0 %   MCV 84.9 80.0 - 100.0 fL   MCH 29.5 26.0 - 34.0 pg   MCHC 34.8 30.0 - 36.0 g/dL   RDW 97.3 53.2 - 99.2 %   Platelets 177 150 - 400 K/uL   nRBC 0.0 0.0 - 0.2 %    Comment: Performed at [facility], [address] [place], [place]     Assessment:   24 y.o. G1P1001 postoperativeday # 1 s/p Cesarean section for breech presentation-stable Continue postoperative and postpartum care Support breast feeding  Plan:  1) Acute blood loss anemia - hemodynamically stable and asymptomatic - po ferrous sulfate/ vitamin C and prenatal vitamins  2) --/--/A POS (12/31 1137) / 3.85 (06/12 1030) / Varicella NI-offer Varivax at  discharge  3) TDAP UTD  4) Breast/Contraception-pill  5) Disposition-possible discharge tomorrow  07-20-1970, CNM

## 2019-12-17 NOTE — Anesthesia Post-op Follow-up Note (Signed)
  Anesthesia Pain Follow-up Note  Patient: Amy Sullivan  Day #: 1  Date of Follow-up: 12/17/2019 Time: 9:01 AM  Last Vitals:  Vitals:   12/17/19 0258 12/17/19 0741  BP: 108/75 110/75  Pulse: 80 67  Resp: 18 18  Temp: 36.7 C 37.1 C  SpO2: 99% 98%    Level of Consciousness: alert  Pain: none   Side Effects:None  Catheter Site Exam:clean, dry, no drainage     Plan: D/C from anesthesia care at surgeon's request  Elmarie Mainland

## 2019-12-17 NOTE — Anesthesia Postprocedure Evaluation (Signed)
Anesthesia Post Note  Patient: Amy Sullivan  Procedure(s) Performed: CESAREAN SECTION (N/A Abdomen)  Patient location during evaluation: Mother Baby Anesthesia Type: Spinal Level of consciousness: oriented and awake and alert Pain management: pain level controlled Vital Signs Assessment: post-procedure vital signs reviewed and stable Respiratory status: spontaneous breathing and respiratory function stable Cardiovascular status: blood pressure returned to baseline and stable Postop Assessment: no headache, no backache, no apparent nausea or vomiting and able to ambulate Anesthetic complications: no     Last Vitals:  Vitals:   12/17/19 0258 12/17/19 0741  BP: 108/75 110/75  Pulse: 80 67  Resp: 18 18  Temp: 36.7 C 37.1 C  SpO2: 99% 98%    Last Pain:  Vitals:   12/17/19 0741  TempSrc: Oral  PainSc:                  Elmarie Mainland

## 2019-12-18 ENCOUNTER — Encounter: Admission: RE | Payer: Self-pay | Source: Home / Self Care

## 2019-12-18 ENCOUNTER — Inpatient Hospital Stay
Admission: RE | Admit: 2019-12-18 | Payer: Medicaid Other | Source: Home / Self Care | Admitting: Obstetrics and Gynecology

## 2019-12-18 SURGERY — Surgical Case
Anesthesia: Choice

## 2019-12-18 NOTE — Care Management (Signed)
RN CM: Met with mother, grandmother and infant in the room. Reviewed positive THC in June 2020 and pending cord tissue. Infants UDS was negative. Patient states she has done no drugs or alcohol since finding out she was pregnant. FOB is not involved at his own choice however patient has strong support system with her family. Patient and infant will live with patient's parents. Patient with questions regarding paternity testing and child support. Patient states she had some episodes of sadness before pregnancy but has been very happy since pregnancy and since birth. RN CM reviewed PPD and SIDS with patient and patient's mother at her request. Reviewed [facility] services and answered questions related to pregnancy Medicaid. Mother was working and going to school prior to delivery and plans to return to that once infant is older. Family assists with caring for infants. Patient with no history of medications or therapy for depression but does enjoy spending time at [place] to [place] and reduce stress. Patient is working with [facility] to improve breastfeeding but is open to formula supplement if needed. No other needs or concerns at this time. RN CM will continue to follow for cord tissue testing and update as needed.

## 2019-12-18 NOTE — Lactation Note (Signed)
This note was copied from a baby's chart. Lactation Consultation Note  Patient Name: Amy Sullivan [id number] Date: 12/18/2019 Reason for consult: Follow-up assessment;Mother's request;1st time breastfeeding  Mom called out to [facility] for observation of feed. Baby was latched in football hold on right breast for feeding, actively sucking with strong rhythmic pattern, audible swallows heard. Baby fed for 14 minutes before unlatching on her own, and appearing content at mother's side.  LC encouraged mom breastfeeding from left side, and continued stimulation from use of pump after feeds to help with milk transition from colostrum to transitional and mature milk, explaining milk supply and demand.  LC encouraged mom to continue to call out for support as needed.  Maternal Data Formula Feeding for Exclusion: No Has patient been taught Hand Expression?: Yes Does the patient have breastfeeding experience prior to this delivery?: No  Feeding Feeding Type: Breast Fed  LATCH Score Latch: Grasps breast easily, tongue down, lips flanged, rhythmical sucking.  Audible Swallowing: Spontaneous and intermittent  Type of Nipple: Everted at rest and after stimulation  Comfort (Breast/Nipple): Filling, red/small blisters or bruises, mild/mod discomfort(left side)  Hold (Positioning): No assistance needed to correctly position infant at breast.  LATCH Score: 9  Interventions Interventions: Breast feeding basics reviewed  Lactation Tools Discussed/Used Tools: Nipple Shields Nipple shield size: 20   Consult Status Consult Status: Follow-up Date: 12/18/19 Follow-up type: In-patient    Danford Bad 12/18/2019, 4:37 PM

## 2019-12-18 NOTE — Lactation Note (Signed)
This note was copied from a baby's chart. Lactation Consultation Note  Patient Name: Girl Fredna Stricker Today's Date: 12/18/2019 Reason for consult: Initial assessment  This is mom's first baby delivered via c-section 2 days ago due to breech position. RN notes that mom has had difficulty with feedings, reporting nipple damage to left nipple. Mom has primarily been pumping left breast to allow healing along with use of coconut oil and comfort gels, and putting baby to breast on right breast. Feedings are lasting up to 40-45 minutes with baby not appearing satisfied. Overnight last night mom and grandmother did ask for formula supplement to help satisfy baby, night RN did educate mom on impact, but gave formula and provided a syringe for feedings.   LC entered to Grandmother feeding 20ml formula supplement with a syringe. Baby appeared agitated, and mother and grandmother explained that baby had fed at the breast for 30 minutes but was still very hungry. LC assisted mom with bringing baby to the right breast in football hold to observe a feed after completing an oral assessment. Baby is doing more of a chomping motion than a good suck and therefore is probably transferring less colostrum during feeds.   LC and LC student supplemented baby with a further 5 ml formula while doing suck training, and baby demonstrated an improvement in sucking motion during supplementation.   Mother was educated to pump both breasts routinely today to ensure that they are being adequately stimulated, and to call for the [facility] at the next feeding to see if there is improvement in baby feeding at the breast.    Maternal Data Formula Feeding for Exclusion: No  Feeding Feeding Type: Breast Fed  LATCH Score Latch: Grasps breast easily, tongue down, lips flanged, rhythmical sucking.  Audible Swallowing: None  Type of Nipple: Everted at rest and after stimulation  Comfort (Breast/Nipple): Filling, red/small blisters  or bruises, mild/mod discomfort(Right breast comfortable, Left has nipple damage )  Hold (Positioning): No assistance needed to correctly position infant at breast.  LATCH Score: 7  Interventions Interventions: Assisted with latch;Breast compression;DEBP  Lactation Tools Discussed/Used     Consult Status Consult Status: Follow-up Date: 12/18/19 Follow-up type: In-patient    Danford Bad 12/18/2019, 10:12 AM

## 2019-12-18 NOTE — Lactation Note (Signed)
This note was copied from a baby's chart. Lactation Consultation Note  Patient Name: Amy Sullivan [id number] Date: 12/18/2019 Reason for consult: Follow-up assessment;Mother's request;1st time breastfeeding;Term  LC called into room by mom for breastfeeding assistance. Mom reports baby has been sleeping since last visit, waking only for dirty diaper, and grandmother did give 0.37ml expressed colostrum via syringe that mom had previously pumped. LC brought baby to mom in football hold on right breast, as mom is still hesitant to feed on blistered left breast. LC adjusted baby's position to turn inward towards the breast, and slid baby further back so nose was opposite of nipple. Mom independently held her own breast tissue, and brought baby to breast; baby grasped the breast easily. Upon latching, baby initially began with chomping motion, LC pulling down on chin, was able to help baby achieve a strong rhythmic sucking pattern, temporarily; multiple correction with bottom lip, and pulling down on bottom chin. Mom was able to start distinguishing between chomping and smooth rhythmic sucks.  LC did suggest use of nipple shield, size 20, to see if the infant may benefit from the structure provided. Baby did grasp breast easily with nipple shield, began with strong rhythmic sucking, and reverted back to chomping, with LC pulling on lower chin to help; colostrum evident in shield, along with audible swallows. Baby finished feed without nipple shield, but audible swallows throughout duration of rest of feed, unlatching on her own appearing content. Encouraged mom to continue pumping post feeds, especially on left breast to help encourage stimulation, and onset of transitional and mature milk.  Encouraged to continue to call out for support for breastfeeding as needed.  Maternal Data Formula Feeding for Exclusion: No Has patient been taught Hand Expression?: Yes Does the patient have breastfeeding  experience prior to this delivery?: No  Feeding Feeding Type: Breast Fed  LATCH Score Latch: Grasps breast easily, tongue down, lips flanged, rhythmical sucking.  Audible Swallowing: Spontaneous and intermittent  Type of Nipple: Everted at rest and after stimulation  Comfort (Breast/Nipple): Filling, red/small blisters or bruises, mild/mod discomfort(left nipple)  Hold (Positioning): Assistance needed to correctly position infant at breast and maintain latch.  LATCH Score: 8  Interventions Interventions: Breast feeding basics reviewed;Assisted with latch;Breast massage;Breast compression;Hand express;Adjust position;Coconut oil;Comfort gels  Lactation Tools Discussed/Used Tools: Nipple Shields Nipple shield size: 20   Consult Status Consult Status: Follow-up Date: 12/18/19 Follow-up type: In-patient    Danford Bad 12/18/2019, 2:20 PM

## 2019-12-18 NOTE — Progress Notes (Signed)
Obstetric Postpartum/PostOperative Daily Progress Note Subjective:  24 y.o. G1P1001 post-operative day # 2 status post primary cesarean delivery.  She is ambulating, is tolerating po, is voiding spontaneously.  Her pain is well controlled on PO pain medications and On Q pump. Her lochia is less than menses. Having some struggles with breastfeeding especially on left breast with sore/cracked nipple. LC was not available yesterday. RN/CNM assisting with education/latch. Baby currently on right breast sucking and seems satisfied.    Medications SCHEDULED MEDICATIONS  . vitamin C  500 mg Oral BID  . ferrous sulfate  325 mg Oral BID WC  . ibuprofen  800 mg Oral Q6H  . prenatal multivitamin  1 tablet Oral Q1200  . senna-docusate  2 tablet Oral Q24H  . simethicone  80 mg Oral TID PC  . simethicone  80 mg Oral Q24H  . varicella virus vaccine live  0.5 mL Subcutaneous Once    MEDICATION INFUSIONS  . bupivacaine ON-Q pain pump    . lactated ringers Stopped (12/16/19 1015)  . naLOXone [facility]) adult infusion for PRURITIS      PRN MEDICATIONS  acetaminophen, coconut oil, witch hazel-glycerin **AND** dibucaine, diphenhydrAMINE **OR** diphenhydrAMINE, diphenhydrAMINE, menthol-cetylpyridinium, morphine injection, nalbuphine **OR** nalbuphine, nalbuphine **OR** nalbuphine, naloxone **AND** sodium chloride flush, naLOXone (NARCAN) adult infusion for PRURITIS, oxyCODONE-acetaminophen, simethicone, zolpidem    Objective:   Vitals:   12/17/19 0258 12/17/19 0741 12/17/19 1126 12/17/19 2345  BP: 108/75 110/75 114/85 121/84  Pulse: 80 67 83 84  Resp: 18 18 18 16   Temp: 98.1 F (36.7 C) 98.7 F (37.1 C) 99.4 F (37.4 C) 98.1 F (36.7 C)  TempSrc: Oral Oral Oral Oral  SpO2: 99% 98%    Weight:      Height:        Current Vital Signs 24h Vital Sign Ranges  T 98.1 F (36.7 C) Temp  Avg: 98.8 F (37.1 C)  Min: 98.1 F (36.7 C)  Max: 99.4 F (37.4 C)  BP 121/84 BP  Min: 114/85  Max: 121/84  HR 84  Pulse  Avg: 83.5  Min: 83  Max: 84  RR 16 Resp  Avg: 17  Min: 16  Max: 18  SaO2 98 %(Room Air) Room Air No data recorded       24 Hour I/O Current Shift I/O  Time Ins Outs No intake/output data recorded. No intake/output data recorded.  General: NAD Pulmonary: no increased work of breathing Abdomen: non-distended, non-tender, fundus firm at level of umbilicus Inc: Clean/dry/intact, On Q pump intact Extremities: no edema, no erythema, no tenderness  Labs:  Recent Labs  Lab 12/13/19 1137 12/17/19 0517  WBC 7.7 10.7*  HGB 11.2* 8.8*  HCT 33.2* 25.3*  PLT 209 177     Assessment:   24 y.o. G1P1001 postoperative day # 2 status post primary cesarean section, lactating  Plan:  1) Acute blood loss anemia - hemodynamically stable and asymptomatic - po ferrous sulfate  2) A POS / Rubella 3.85 (06/12 1030)/ Varicella Not immune  3) TDAP status: given antepartum  4) Breastfeeding with some supplemental formula, lactation consult today  5) Contraception = oral progesterone-only contraceptive  6) Disposition: continue current care   07-20-1970, CNM 12/18/2019 9:40 AM

## 2019-12-19 MED ORDER — OXYCODONE HCL 5 MG PO TABS: 5.0000 mg | ORAL_TABLET | Freq: Four times a day (QID) | ORAL | 0 refills | Status: AC | PRN

## 2019-12-19 NOTE — Lactation Note (Signed)
This note was copied from a baby's chart. Lactation Consultation Note  Patient Name: Amy Sullivan [id number] Date: 12/19/2019 Reason for consult: Follow-up assessment;Mother's request;1st time breastfeeding;Term  Lactation in to observe final feed before discharge. Baby was having difficulty latching due to mom's breast swollen and experiencing normal fullness. After multiple attempts without, nipple shield was placed, and baby grasped breast easily, began strong rhythmic sucking pattern with no chomping present, and spontaneous swallowing. Reviewed with mom changes in breast, differences in breast fullness and engorgement, management of fullness, and nipple care. Encouraged continued on demand feedings, aiming for feedings or milk expression 8-12x a day. Reviewed normal stomach size, growth spurts/cluster feeding, wet/stool diaper expectations for age, signs of milk transfer. Gave information for outpatient lactation consult services, and community breastfeeding support resources, including WIC contact information.   Maternal Data Formula Feeding for Exclusion: No Has patient been taught Hand Expression?: Yes Does the patient have breastfeeding experience prior to this delivery?: No  Feeding Feeding Type: Breast Fed  LATCH Score                   Interventions Interventions: Breast feeding basics reviewed;Adjust position;Support pillows  Lactation Tools Discussed/Used Nipple shield size: 20   Consult Status Consult Status: Complete Date: 12/19/19 Follow-up type: Call as needed    Danford Bad 12/19/2019, 3:32 PM

## 2019-12-19 NOTE — Discharge Instructions (Signed)
Please call your doctor or return to the ER if you experience any chest pains, shortness of breath, dizziness, visual changes, severe headache (unrelieved by pain meds), fever greater than 101, any heavy bleeding (saturating more than 1 pad per hour), large clots, or foul smelling discharge, any worsening abdominal pain and cramping that is not controlled by pain medication, any calf/leg pain or redness, any breast concerns (redness/pain), or any signs of postpartum depression. No tampons, enemas, douches, or sexual intercourse for 6 weeks. Also avoid tub baths, hot tubs, or swimming for 6 weeks.    Check your incision daily for any signs of infection such as redness, warmth, swelling, increased pain, or pus/foul smelling drainage   Activity: do not lift over 10-15 lbs for 6 weeks  No driving for 1-2 weeks  Pelvic rest for 6 weeks 

## 2019-12-19 NOTE — Lactation Note (Signed)
This note was copied from a baby's chart. Lactation Consultation Note  Patient Name: Amy Sullivan [id number] Date: 12/19/2019 Reason for consult: Follow-up assessment;1st time breastfeeding  Mom continues with breastfeeding. She has reported feeling more comfortable with baby at breast, and helping baby get a deeper latch to aid in starting rhythmic sucking instead of chomping. Mom has stayed consistent with pumping, and is now able to express 65mL of colostrum/transition milk, which she is giving via syringe and/or bottle. LC encouraged mom to resume feeding on left breast, continued use of coconut oil, and comfort gels to promote ongoing healing, and use of DEBP once home to ensure adequate emptying of the breast. Reviewed milk supply and demand, onset of transitional to mature milk, feedings and/or pumpings 8-12x/day, continued tracking of wet/stool diapers, signs of milk transfer and baby being content.  Information given for outpatient lactation consult services, and community breastfeeding support. Mom does plan to enroll in the [facility] program; contact information given. Mom and baby will be discharged today, encouraged mom to call out for lactation support before going home as needed.  Maternal Data Formula Feeding for Exclusion: No Has patient been taught Hand Expression?: Yes Does the patient have breastfeeding experience prior to this delivery?: No  Feeding Feeding Type: Breast Milk Nipple Type: Slow - flow  LATCH Score                   Interventions Interventions: Breast feeding basics reviewed  Lactation Tools Discussed/Used     Consult Status Consult Status: Complete Date: 12/19/19 Follow-up type: Call as needed    Danford Bad 12/19/2019, 11:08 AM

## 2019-12-19 NOTE — Progress Notes (Signed)
Discharge order received from doctor. Varicella vaccine offered at discharge. Pt refused varicella vaccine.Incision cleaning kit given and reviewed.  Reviewed discharge instructions including On-Q pump removal instructions and prescriptions with patient and answered all questions. Follow up appointment given. Patient verbalized understanding. ID bands checked. Patient discharged home with infant via wheelchair by nursing/auxillary.    Hilbert Bible, RN

## 2019-12-25 ENCOUNTER — Ambulatory Visit (INDEPENDENT_AMBULATORY_CARE_PROVIDER_SITE_OTHER): Payer: Medicaid Other | Admitting: Obstetrics & Gynecology

## 2019-12-25 ENCOUNTER — Encounter: Payer: Self-pay | Admitting: Obstetrics & Gynecology

## 2019-12-25 ENCOUNTER — Other Ambulatory Visit: Payer: Self-pay

## 2019-12-25 NOTE — Progress Notes (Signed)
  Postoperative Follow-up Patient presents post op from recent Cesarean Section performed for Malpresentation breech, 1 week ago.   Subjective: Patient reports marked improvement in her immediate post op symptoms. Eating a regular diet without difficulty. Pain is controlled with current analgesics. Medications being used: ibuprofen (OTC).  Activity: normal activities of daily living. Patient reports additional symptom's since surgery of appropriate lochia, no signs of depression, and no signs of mastitis.  Objective: BP 100/80   Ht 5\' 2"  (1.575 m)   Wt 136 lb (61.7 kg)   BMI 24.87 kg/m  Physical Exam Constitutional:      General: She is not in acute distress.    Appearance: She is well-developed.  Cardiovascular:     Rate and Rhythm: Normal rate.  Pulmonary:     Effort: Pulmonary effort is normal.  Abdominal:     General: There is no distension.     Palpations: Abdomen is soft.     Tenderness: There is no abdominal tenderness.     Comments: Incision Healing Well   Musculoskeletal:        General: Normal range of motion.  Neurological:     Mental Status: She is alert and oriented to person, place, and time.     Cranial Nerves: No cranial nerve deficit.  Skin:    General: Skin is warm and dry.     Assessment: s/p : Cesarean Section for Malpresentation breech progressing well  Plan: Patient has done well after her Cesarean Section with no apparent complications.  I have discussed the post-operative course to date, and the expected progress moving forward.  The patient understands what complications to be concerned about.  I will see the patient in routine follow up, or sooner if needed.    Activity plan: No heavy lifting.  Pelvic rest. She desires abstinence for postpartum contraception.  Marland Kitchen 12/25/2019, 4:53 PM

## 2020-01-29 ENCOUNTER — Other Ambulatory Visit: Payer: Self-pay

## 2020-01-29 ENCOUNTER — Ambulatory Visit (INDEPENDENT_AMBULATORY_CARE_PROVIDER_SITE_OTHER): Payer: Medicaid Other | Admitting: Obstetrics & Gynecology

## 2020-01-29 ENCOUNTER — Encounter: Payer: Self-pay | Admitting: Obstetrics & Gynecology

## 2020-01-29 NOTE — Progress Notes (Signed)
  OBSTETRICS POSTPARTUM CLINIC PROGRESS NOTE  Subjective:     Amy Sullivan is a 24 y.o. G42P1001 female who presents for a postpartum visit. She is 6 weeks postpartum following a Term pregnancy and delivery by C-section.  I have fully reviewed the prenatal and intrapartum course. Anesthesia: spinal.  Postpartum course has been complicated by uncomplicated.  Baby is feeding by Bottle.  Bleeding: patient has not  resumed menses.  Bowel function is normal. Bladder function is normal.  Patient is not sexually active. Contraception method desired is abstinence.  Postpartum depression screening: negative. [place] 0.  The following portions of the patient's history were reviewed and updated as appropriate: allergies, current medications, past family history, past medical history, past social history, past surgical history and problem list.  Review of Systems Pertinent items are noted in HPI.  Objective:    BP 100/60   Ht 5\' 2"  (1.575 m)   Wt 135 lb (61.2 kg)   BMI 24.69 kg/m   General:  alert and no distress   Breasts:  inspection negative, no nipple discharge or bleeding, no masses or nodularity palpable  Lungs: clear to auscultation bilaterally  Heart:  regular rate and rhythm, S1, S2 normal, no murmur, click, rub or gallop  Abdomen: soft, non-tender; bowel sounds normal; no masses,  no organomegaly.  Well healed Pfannenstiel incision   Vulva:  normal  Vagina: normal vagina, no discharge, exudate, lesion, or erythema  Cervix:  no cervical motion tenderness and no lesions  Corpus: normal size, contour, position, consistency, mobility, non-tender  Adnexa:  normal adnexa and no mass, fullness, tenderness  Rectal Exam: Not performed.          Assessment:  Post Partum Care visit 1. Postpartum care following cesarean delivery  Plan:  See orders and Patient Instructions Resume all normal activities Follow up in: 4 months for annual/PAP or as needed.  Not sexually active,  will consider low dose OCP in future  , MD, Annamarie Major Ob/Gyn, [facility] 01/29/2020  1:44 PM

## 2020-05-27 ENCOUNTER — Ambulatory Visit: Payer: Medicaid Other | Admitting: Obstetrics & Gynecology

## 2020-05-29 ENCOUNTER — Encounter: Payer: Self-pay | Admitting: Obstetrics and Gynecology

## 2020-05-29 ENCOUNTER — Other Ambulatory Visit: Payer: Self-pay

## 2020-05-29 ENCOUNTER — Other Ambulatory Visit (HOSPITAL_COMMUNITY)
Admission: RE | Admit: 2020-05-29 | Discharge: 2020-05-29 | Disposition: A | Discharge status: Home / Self Care | Payer: Medicaid Other | Source: Ambulatory Visit | Attending: Obstetrics and Gynecology | Admitting: Obstetrics and Gynecology

## 2020-05-29 ENCOUNTER — Ambulatory Visit (INDEPENDENT_AMBULATORY_CARE_PROVIDER_SITE_OTHER): Payer: Medicaid Other | Admitting: Obstetrics and Gynecology

## 2020-05-29 VITALS — BP 118/74 | Ht 63.0 in | Wt 130.0 lb

## 2020-05-29 DIAGNOSIS — Z124 Encounter for screening for malignant neoplasm of cervix: Secondary | ICD-10-CM | POA: Insufficient documentation

## 2020-05-29 DIAGNOSIS — Z1331 Encounter for screening for depression: Secondary | ICD-10-CM | POA: Diagnosis not present

## 2020-05-29 DIAGNOSIS — Z0000 Encounter for general adult medical examination without abnormal findings: Secondary | ICD-10-CM | POA: Diagnosis not present

## 2020-05-29 DIAGNOSIS — Z113 Encounter for screening for infections with a predominantly sexual mode of transmission: Secondary | ICD-10-CM | POA: Diagnosis present

## 2020-05-29 DIAGNOSIS — Z01419 Encounter for gynecological examination (general) (routine) without abnormal findings: Secondary | ICD-10-CM | POA: Insufficient documentation

## 2020-05-29 DIAGNOSIS — Z1339 Encounter for screening examination for other mental health and behavioral disorders: Secondary | ICD-10-CM | POA: Diagnosis not present

## 2020-05-29 DIAGNOSIS — Z30011 Encounter for initial prescription of contraceptive pills: Secondary | ICD-10-CM

## 2020-05-29 MED ORDER — NORETHIN ACE-ETH ESTRAD-FE 1-20 MG-MCG PO TABS: 1.0000 | ORAL_TABLET | Freq: Every day | ORAL | 4 refills | Status: DC

## 2020-05-29 NOTE — Progress Notes (Signed)
Gynecology Annual Exam  PCP: [facility], [place]  Chief Complaint  Patient presents with  . Annual Exam   History of Present Illness:  [name] is a 24 y.o. G1P1001 who LMP was Patient's last menstrual period was 05/19/2020., presents today for her annual examination.  Her menses are regular every 28-30 days, lasting 5 day(s).  Dysmenorrhea none. She does not have intermenstrual bleeding.  She is sexually active.  She has been using nothing for contraception.   Last Pap: 05/2019  Results were: no abnormalities /neg HPV DNA not done Hx of STDs: none  There is no FH of breast cancer. There is no FH of ovarian cancer. The patient does not do self-breast exams.  Tobacco use: The patient denies current or previous tobacco use. Alcohol use: social drinker Exercise: not much  The patient wears seatbelts: yes.   The patient reports that domestic violence in her life is absent.   Past Medical History:  Diagnosis Date  . GERD (gastroesophageal reflux disease)   . 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 Pink eye     Past Surgical History:  Procedure Laterality Date  . CESAREAN SECTION N/A 12/16/2019   Procedure: CESAREAN SECTION;  Surgeon: Malachy Mood, MD;  Location: [facility] ORS;  Service: Obstetrics;  Laterality: N/A;  . WISDOM TOOTH EXTRACTION      Prior to Admission medications   Medication Sig Start Date End Date Taking? Authorizing Provider  Prenatal Vit-Fe Fumarate-FA (PRENATAL MULTIVITAMIN) TABS tablet Take 1 tablet by mouth daily at 12 noon.   Yes [provider]   Allergies: No Known Allergies  Obstetric History: G1P1001  Social History   Socioeconomic History  . Marital status: Single    Spouse name: Not on file  . Number of children: Not on file  . Years of education: Not on file  . Highest education level: Not on file  Occupational History  . Not on file  Tobacco Use  . Smoking status: Former Smoker    Packs/day: 0.00    Types:  Cigarettes  . Smokeless tobacco: Never Used  . Tobacco comment: quit at 3 months pregnant  Vaping Use  . Vaping Use: Never used  Substance and Sexual Activity  . Alcohol use: Yes    Comment: 5-6 beers a day, she would drink all day   . Drug use: Yes    Types: Marijuana    Comment: quit at [redacted] wks gestational age  . Sexual activity: Not Currently  Other Topics Concern  . Not on file  Social History Narrative  . Not on file   Social Determinants of Health   Financial Resource Strain:   . Difficulty of Paying Living Expenses:   Food Insecurity:   . Worried About Charity fundraiser in the Last Year:   . Arboriculturist in the Last Year:   Transportation Needs:   . Film/video editor (Medical):   Marland Kitchen Lack of Transportation (Non-Medical):   Physical Activity:   . Days of Exercise per Week:   . Minutes of Exercise per Session:   Stress:   . Feeling of Stress :   Social Connections:   . Frequency of Communication with Friends and Family:   . Frequency of Social Gatherings with Friends and Family:   . Attends Religious Services:   . Active Member of Clubs or Organizations:   . Attends Archivist Meetings:   Marland Kitchen Marital Status:   Intimate  Partner Violence:   . Fear of Current or Ex-Partner:   . Emotionally Abused:   Marland Kitchen Physically Abused:   . Sexually Abused:     Family History  Problem Relation Age of Onset  . Cancer Maternal Grandmother 40       cyst or cancer not sure    Review of Systems  Constitutional: Negative.   HENT: Negative.   Eyes: Negative.   Respiratory: Negative.   Cardiovascular: Negative.   Gastrointestinal: Negative.   Genitourinary: Negative.   Musculoskeletal: Negative.   Skin: Negative.   Neurological: Negative.   Psychiatric/Behavioral: Negative.      Physical Exam BP 118/74   Ht 5\' 3"  (1.6 m)   Wt 130 lb (59 kg)   LMP 05/19/2020   BMI 23.03 kg/m    Physical Exam Constitutional:      General: She is not in acute  distress.    Appearance: Normal appearance. She is well-developed.  Genitourinary:     Pelvic exam was performed with patient in the lithotomy position.     Vulva, urethra, bladder and uterus normal.     No inguinal adenopathy present in the right or left side.    No signs of injury in the vagina.     No vaginal discharge, erythema, tenderness or bleeding.     No cervical motion tenderness, discharge, lesion or polyp.     Uterus is mobile.     Uterus is not enlarged or tender.     No uterine mass detected.    Uterus is anteverted.     No right or left adnexal mass present.     Right adnexa not tender or full.     Left adnexa not tender or full.  HENT:     Head: Normocephalic and atraumatic.  Eyes:     General: No scleral icterus.    Conjunctiva/sclera: Conjunctivae normal.  Neck:     Thyroid: No thyromegaly.  Cardiovascular:     Rate and Rhythm: Normal rate and regular rhythm.     Heart sounds: No murmur heard.  No friction rub. No gallop.   Pulmonary:     Effort: Pulmonary effort is normal. No respiratory distress.     Breath sounds: Normal breath sounds. No wheezing or rales.  Chest:     Breasts:        Right: No inverted nipple, mass, nipple discharge, skin change or tenderness.        Left: No inverted nipple, mass, nipple discharge, skin change or tenderness.  Abdominal:     General: Bowel sounds are normal. There is no distension.     Palpations: Abdomen is soft. There is no mass.     Tenderness: There is no abdominal tenderness. There is no guarding or rebound.  Musculoskeletal:        General: No swelling or tenderness. Normal range of motion.     Cervical back: Normal range of motion and neck supple.  Lymphadenopathy:     Cervical: No cervical adenopathy.     Lower Body: No right inguinal adenopathy. No left inguinal adenopathy.  Neurological:     General: No focal deficit present.     Mental Status: She is alert and oriented to person, place, and time.      Cranial Nerves: No cranial nerve deficit.  Skin:    General: Skin is warm and dry.     Findings: No erythema or rash.  Psychiatric:        Mood and  Affect: Mood normal.        Behavior: Behavior normal.        Judgment: Judgment normal.     Female chaperone present for pelvic and breast  portions of the physical exam  Results: AUDIT Questionnaire (screen for alcoholism): 4 PHQ-9: 0   Assessment: 24 y.o. G70P1001 female here for routine annual gynecologic examination  Plan: Problem List Items Addressed This Visit    None    Visit Diagnoses    Women's annual routine gynecological examination    -  Primary   Screening for depression       Screening for alcoholism       Pap smear for cervical cancer screening       Screen for STD (sexually transmitted disease)          Screening: -- Blood pressure screen normal -- Weight screening: normal -- Depression screening negative (PHQ-9) -- Nutrition: normal -- cholesterol screening: not due for screening -- osteoporosis screening: not due -- tobacco screening: not using -- alcohol screening: AUDIT questionnaire indicates low-risk usage. -- family history of breast cancer screening: done. not at high risk. -- no evidence of domestic violence or intimate partner violence. -- STD screening: gonorrhea/chlamydia NAAT collected -- pap smear collected per ASCCP guidelines -- HPV vaccination series: offered. Patient will consider.   Thomasene Mohair, MD 05/29/2020 4:11 PM

## 2020-06-02 LAB — CYTOLOGY - PAP
Chlamydia: NEGATIVE
Comment: NEGATIVE
Comment: NORMAL
Diagnosis: NEGATIVE
Neisseria Gonorrhea: NEGATIVE

## 2020-06-04 ENCOUNTER — Encounter: Payer: Self-pay | Admitting: Obstetrics and Gynecology

## 2020-09-16 ENCOUNTER — Ambulatory Visit (INDEPENDENT_AMBULATORY_CARE_PROVIDER_SITE_OTHER): Payer: 59 | Admitting: Obstetrics and Gynecology

## 2020-09-16 ENCOUNTER — Encounter: Payer: Self-pay | Admitting: Obstetrics and Gynecology

## 2020-09-16 ENCOUNTER — Other Ambulatory Visit: Payer: Self-pay

## 2020-09-16 VITALS — BP 100/70 | Ht 62.0 in | Wt 134.0 lb

## 2020-09-16 DIAGNOSIS — B373 Candidiasis of vulva and vagina: Secondary | ICD-10-CM | POA: Diagnosis not present

## 2020-09-16 DIAGNOSIS — B3731 Acute candidiasis of vulva and vagina: Secondary | ICD-10-CM

## 2020-09-16 LAB — POCT WET PREP WITH KOH
Clue Cells Wet Prep HPF POC: NEGATIVE
KOH Prep POC: NEGATIVE
Trichomonas, UA: NEGATIVE
Yeast Wet Prep HPF POC: POSITIVE

## 2020-09-16 MED ORDER — FLUCONAZOLE 150 MG PO TABS: 150.0000 mg | ORAL_TABLET | Freq: Once | ORAL | 0 refills | Status: AC

## 2020-09-16 NOTE — Patient Instructions (Signed)
I value your feedback and entrusting us with your care. If you get a [facility] patient survey, I would appreciate you taking the time to let us know about your experience today. Thank you!  As of November 22, 2019, your lab results will be released to your MyChart immediately, before I even have a chance to see them. Please give me time to review them and contact you if there are any abnormalities. Thank you for your patience.  

## 2020-09-16 NOTE — Progress Notes (Signed)
[facility], [place]   Chief Complaint  Patient presents with  . Vaginal Discharge    itchiness, irritation, no odor x 4 days    HPI:      [name] is a 24 y.o. G1P1001 whose LMP was Patient's last menstrual period was 09/08/2020 (approximate)., presents today for increased vag d/c with itching/irritation, no fishy odor, for 4 days. No recent abx use, no meds to treat. No new soaps. No urin sx except dysuria. No LBP, pelvic pain, fevers. She is sex active, no new partners. On OCPs. Neg STD testing 12/20  Past Medical History:  Diagnosis Date  . GERD (gastroesophageal reflux disease)   . 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 Pink eye     Past Surgical History:  Procedure Laterality Date  . CESAREAN SECTION N/A 12/16/2019   Procedure: CESAREAN SECTION;  Surgeon: Vena Austria, MD;  Location: [facility] ORS;  Service: Obstetrics;  Laterality: N/A;  . WISDOM TOOTH EXTRACTION      Family History  Problem Relation Age of Onset  . Cancer Maternal Grandmother 40       cyst or cancer not sure  . Breast cancer Neg Hx     Social History   Socioeconomic History  . Marital status: Single    Spouse name: Not on file  . Number of children: Not on file  . Years of education: Not on file  . Highest education level: Not on file  Occupational History  . Not on file  Tobacco Use  . Smoking status: Former Smoker    Packs/day: 0.00    Types: Cigarettes  . Smokeless tobacco: Never Used  . Tobacco comment: quit at 3 months pregnant  Vaping Use  . Vaping Use: Never used  Substance and Sexual Activity  . Alcohol use: Yes    Comment: 5-6 beers a day, she would drink all day   . Drug use: Yes    Types: Marijuana    Comment: quit at [redacted] wks gestational age  . Sexual activity: Yes    Birth control/protection: Pill  Other Topics Concern  . Not on file  Social History Narrative  . Not on file   Social Determinants of Health   Financial Resource Strain:   .  Difficulty of Paying Living Expenses: Not on file  Food Insecurity:   . Worried About Programme researcher, broadcasting/film/video in the Last Year: Not on file  . Ran Out of Food in the Last Year: Not on file  Transportation Needs:   . Lack of Transportation (Medical): Not on file  . Lack of Transportation (Non-Medical): Not on file  Physical Activity:   . Days of Exercise per Week: Not on file  . Minutes of Exercise per Session: Not on file  Stress:   . Feeling of Stress : Not on file  Social Connections:   . Frequency of Communication with Friends and Family: Not on file  . Frequency of Social Gatherings with Friends and Family: Not on file  . Attends Religious Services: Not on file  . Active Member of Clubs or Organizations: Not on file  . Attends Banker Meetings: Not on file  . Marital Status: Not on file  Intimate Partner Violence:   . Fear of Current or Ex-Partner: Not on file  . Emotionally Abused: Not on file  . Physically Abused: Not on file  . Sexually Abused: Not on file    Outpatient Medications Prior  to Visit  Medication Sig Dispense Refill  . norethindrone-ethinyl estradiol (JUNEL FE 1/20) 1-20 MG-MCG tablet Take 1 tablet by mouth daily. 84 tablet 4  . Prenatal Vit-Fe Fumarate-FA (PRENATAL MULTIVITAMIN) TABS tablet Take 1 tablet by mouth daily at 12 noon.     No facility-administered medications prior to visit.      ROS:  Review of Systems  Constitutional: Negative for fever.  Gastrointestinal: Negative for blood in stool, constipation, diarrhea, nausea and vomiting.  Genitourinary: Positive for vaginal discharge. Negative for dyspareunia, dysuria, flank pain, frequency, hematuria, urgency, vaginal bleeding and vaginal pain.  Musculoskeletal: Negative for back pain.  Skin: Negative for rash.   BREAST: No symptoms   OBJECTIVE:   Vitals:  BP 100/70   Ht 5\' 2"  (1.575 m)   Wt 134 lb (60.8 kg)   LMP 09/08/2020 (Approximate)   Breastfeeding No   BMI 24.51 kg/m     Physical Exam Vitals reviewed.  Constitutional:      Appearance: She is well-developed.  Pulmonary:     Effort: Pulmonary effort is normal.  Genitourinary:    Pubic Area: No rash.      Labia:        Right: Tenderness present. No rash or lesion.        Left: Tenderness present. No rash or lesion.      Vagina: Vaginal discharge present. No erythema or tenderness.     Cervix: Normal.     Uterus: Normal. Not enlarged and not tender.      Adnexa: Right adnexa normal and left adnexa normal.       Right: No mass or tenderness.         Left: No mass or tenderness.       Comments: BILAT LABIA MINORA WITH ERYTHEMA/SWELLING; NO LESIONS Musculoskeletal:        General: Normal range of motion.     Cervical back: Normal range of motion.  Skin:    General: Skin is warm and dry.  Neurological:     General: No focal deficit present.     Mental Status: She is alert and oriented to person, place, and time.  Psychiatric:        Mood and Affect: Mood normal.        Behavior: Behavior normal.        Thought Content: Thought content normal.        Judgment: Judgment normal.     Results: Results for orders placed or performed in visit on 09/16/20 (from the past 24 hour(s))  POCT Wet Prep with KOH     Status: Normal   Collection Time: 09/16/20  4:20 PM  Result Value Ref Range   Trichomonas, UA Negative    Clue Cells Wet Prep HPF POC neg    Epithelial Wet Prep HPF POC     Yeast Wet Prep HPF POC pos    Bacteria Wet Prep HPF POC     RBC Wet Prep HPF POC     WBC Wet Prep HPF POC     KOH Prep POC Negative Negative     Assessment/Plan: Candidal vaginitis - Plan: fluconazole (DIFLUCAN) 150 MG tablet, POCT Wet Prep with KOH; Pos sx, exam and wet prep. Rx diflucan. Pt prefers oral Rx. F/u prn.    Meds ordered this encounter  Medications  . fluconazole (DIFLUCAN) 150 MG tablet    Sig: Take 1 tablet (150 mg total) by mouth once for 1 dose. Can repeat after 3 days prn  Dispense:  2 tablet     Refill:  0    Order Specific Question:   Supervising Provider    Answer:   Nadara Mustard [[id number]]      Return if symptoms worsen or fail to improve.  Asani Deniston B. Mckenleigh Tarlton, PA-C 09/16/2020 4:21 PM

## 2022-08-09 ENCOUNTER — Ambulatory Visit (INDEPENDENT_AMBULATORY_CARE_PROVIDER_SITE_OTHER): Payer: 59 | Admitting: Advanced Practice Midwife

## 2022-08-09 ENCOUNTER — Encounter: Payer: Self-pay | Admitting: Advanced Practice Midwife

## 2022-08-09 VITALS — BP 120/80 | Ht 62.0 in | Wt 139.0 lb

## 2022-08-09 DIAGNOSIS — N643 Galactorrhea not associated with childbirth: Secondary | ICD-10-CM

## 2022-08-09 DIAGNOSIS — N6452 Nipple discharge: Secondary | ICD-10-CM

## 2022-08-11 ENCOUNTER — Encounter: Payer: Self-pay | Admitting: Advanced Practice Midwife

## 2022-08-11 NOTE — Patient Instructions (Signed)
Galactorrhea Galactorrhea is an abnormal milky discharge from the breast. The discharge may come from one or both nipples. It is different from the normal milk produced in nursing mothers. Galactorrhea usually occurs in women, but it can sometimes affect men. Galactorrhea can be a sign of something more serious, such as diseases of the kidney or thyroid, or problems with the pituitary gland. Your health care provider may do various tests to help determine the cause. What are the causes? This condition may be caused by: Irritation of the breast, which can result from injury, stimulation during sexual activity, or clothes rubbing against the nipple. Certain prescription medicines. Birth control pills. Certain herbal supplements. Changes in hormone levels. Stress. Sometimes the cause is not known. What are the signs or symptoms? The main symptom of this condition is nipple discharge. The discharge is often white, yellow, or green, and can be seen in one or both breasts. The discharge may flow without stopping, or it may stop and start again. How is this diagnosed? This condition may be diagnosed based on: Collecting and testing the discharge. Blood tests. Imaging tests. Tests to rule out other conditions. How is this treated? In many cases, galactorrhea will go away without treatment. In this case, your health care provider will monitor your condition to make sure that it goes away. When treatment is required, your health care provider will treat the underlying cause, such as irritation of the nipples or changes in hormone levels. Certain medicines may be stopped, if they are causing your symptoms. Follow these instructions at home:  Breast care Watch your condition for any changes. Do not squeeze your breasts or nipples. Avoid breast stimulation during sexual activity. Perform a breast self-exam once a month. Doing this more often can irritate your breasts. Avoid clothes that rub on your  nipples. Use breast pads to absorb the discharge. Wear a breast binder or a support bra to help prevent clothes from rubbing on your nipples. General instructions Take over-the-counter and prescription medicines only as told by your health care provider. Keep all follow-up visits. This is important. Contact a health care provider if: You develop hot flashes, vaginal dryness, or a lack of sexual desire. You stop having menstrual periods, or they become irregular or far apart. You have headaches. You have vision problems. Get help right away if: You have breast discharge that is bloody or pus-like. You have breast pain. You feel a lump in your breast. You have wrinkling or dimpling on your breast. You notice that your breast becomes red and swollen. Summary Galactorrhea is an abnormal milky discharge from the breast. The fluid may come from one or both nipples and is often white, yellow, or green. Galactorrhea may be caused by various things, such as irritation of the nipples, medicines, or changes in hormone levels. Galactorrhea often goes away without treatment. However, it also may be a sign of something more serious, such as diseases of the kidney or thyroid, or problems with the pituitary gland. Get help right away if you have discharge that is bloody or pus-like, if you have breast pain or a lump, or if you have skin changes on your breast. This information is not intended to replace advice given to you by your health care provider. Make sure you discuss any questions you have with your health care provider. Document Revised: 09/29/2020 Document Reviewed: 09/29/2020 Elsevier Patient Education  2023 [employer].

## 2022-08-11 NOTE — Progress Notes (Signed)
Patient ID: Amy Sullivan, female   DOB: 14-Nov-1996, 26 y.o.   MRN: [id number]  Reason for Consult: Breast Problem (Left breast discharge for 2 months /)   Subjective:  Date of Service: 08/09/2022  HPI:  ANTWONETTE Sullivan is a 26 y.o. female being seen for discharge she has noticed from her left nipple for the past 2 months. At times there is discharge without stimulation. She is able to express some of the discharge. She describes it as thin, greenish. She has a 10 1/2 year old daughter and did breastfeed for a short time. She has no pain associated and no other breast changes. We discussed normal milk expressed from left breast that can be a normal finding even 2 years after breastfeeding, avoid stimulation. If condition persists or worsens may check prolactin lab levels.  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   Pink eye    Family History  Problem Relation Age of Onset   Cancer Maternal Grandmother 40       cyst or cancer not sure   Breast cancer Neg Hx    Past Surgical History:  Procedure Laterality Date   CESAREAN SECTION N/A 12/16/2019   Procedure: CESAREAN SECTION;  Surgeon: Vena Austria, MD;  Location: [facility] ORS;  Service: Obstetrics;  Laterality: N/A;   WISDOM TOOTH EXTRACTION      Short Social History:  Social History   Tobacco Use   Smoking status: Former    Packs/day: 0.00    Types: Cigarettes   Smokeless tobacco: Never   Tobacco comments:    quit at 3 months pregnant  Substance Use Topics   Alcohol use: Yes    Comment: 5-6 beers a day, she would drink all day     No Known Allergies  No current outpatient medications on file.   No current facility-administered medications for this visit.    Review of Systems  Constitutional:  Negative for chills and fever.  HENT:  Negative for congestion, ear discharge, ear pain, hearing loss, sinus pain and sore throat.   Eyes:   Negative for blurred vision and double vision.  Respiratory:  Negative for cough, shortness of breath and wheezing.   Cardiovascular:  Negative for chest pain, palpitations and leg swelling.  Gastrointestinal:  Negative for abdominal pain, blood in stool, constipation, diarrhea, heartburn, melena, nausea and vomiting.  Genitourinary:  Negative for dysuria, flank pain, frequency, hematuria and urgency.  Musculoskeletal:  Negative for back pain, joint pain and myalgias.  Skin:  Negative for itching and rash.  Neurological:  Negative for dizziness, tingling, tremors, sensory change, speech change, focal weakness, seizures, loss of consciousness, weakness and headaches.  Endo/Heme/Allergies:  Negative for environmental allergies. Does not bruise/bleed easily.  Psychiatric/Behavioral:  Negative for depression, hallucinations, memory loss, substance abuse and suicidal ideas. The patient is not nervous/anxious and does not have insomnia.   Breast: positive for left nipple discharge      Objective:  Objective   Vitals:   08/09/22 1438  BP: 120/80  Weight: 139 lb (63 kg)  Height: 5\' 2"  (1.575 m)   Body mass index is 25.42 kg/m. Constitutional: Well nourished, well developed female in no acute distress.  HEENT: normal Breast: symmetrical, non-tender, no masses, no skin changes, patient self-expressed single drop of milk from left breast Skin: Warm and dry.    Extremity:  no edema   Respiratory:  Normal respiratory  effort Psych: Alert and Oriented x3. No memory deficits. Normal mood and affect.    Assessment/Plan:     27 y.o. G1 P78 female with galactorrhea  Avoid stimulation to breasts Wear supportive bra Follow up as needed   Tresea Mall CNM [facility] 08/11/2022, 1:38 PM

## 2022-12-30 DIAGNOSIS — B309 Viral conjunctivitis, unspecified: Secondary | ICD-10-CM | POA: Diagnosis not present

## 2022-12-30 DIAGNOSIS — H6693 Otitis media, unspecified, bilateral: Secondary | ICD-10-CM | POA: Diagnosis not present

## 2023-07-04 DIAGNOSIS — H66001 Acute suppurative otitis media without spontaneous rupture of ear drum, right ear: Secondary | ICD-10-CM | POA: Diagnosis not present

## 2023-07-04 DIAGNOSIS — Z6824 Body mass index (BMI) 24.0-24.9, adult: Secondary | ICD-10-CM | POA: Diagnosis not present

## 2023-12-13 DIAGNOSIS — J209 Acute bronchitis, unspecified: Secondary | ICD-10-CM | POA: Diagnosis not present

## 2024-01-10 DIAGNOSIS — Z20822 Contact with and (suspected) exposure to covid-19: Secondary | ICD-10-CM | POA: Diagnosis not present

## 2024-01-10 DIAGNOSIS — J101 Influenza due to other identified influenza virus with other respiratory manifestations: Secondary | ICD-10-CM | POA: Diagnosis not present

## 2024-01-10 DIAGNOSIS — R509 Fever, unspecified: Secondary | ICD-10-CM | POA: Diagnosis not present

## 2024-02-24 DIAGNOSIS — J0100 Acute maxillary sinusitis, unspecified: Secondary | ICD-10-CM | POA: Diagnosis not present

## 2024-06-29 DIAGNOSIS — L259 Unspecified contact dermatitis, unspecified cause: Secondary | ICD-10-CM | POA: Diagnosis not present

## 2024-06-29 DIAGNOSIS — B351 Tinea unguium: Secondary | ICD-10-CM | POA: Diagnosis not present
